# Patient Record
Sex: Female | Born: 1955 | Race: Black or African American | Hispanic: No | Smoking: Never smoker
Health system: Southern US, Community
[De-identification: ages and names within clinical notes are randomized; demographics above are authoritative.]

## PROBLEM LIST (undated history)

## (undated) DIAGNOSIS — K635 Polyp of colon: Secondary | ICD-10-CM

## (undated) DIAGNOSIS — K219 Gastro-esophageal reflux disease without esophagitis: Secondary | ICD-10-CM

## (undated) DIAGNOSIS — E7800 Pure hypercholesterolemia, unspecified: Secondary | ICD-10-CM

## (undated) DIAGNOSIS — I10 Essential (primary) hypertension: Secondary | ICD-10-CM

## (undated) DIAGNOSIS — E119 Type 2 diabetes mellitus without complications: Secondary | ICD-10-CM

## (undated) DIAGNOSIS — J45909 Unspecified asthma, uncomplicated: Secondary | ICD-10-CM

## (undated) DIAGNOSIS — E079 Disorder of thyroid, unspecified: Secondary | ICD-10-CM

## (undated) DIAGNOSIS — F39 Unspecified mood [affective] disorder: Secondary | ICD-10-CM

## (undated) DIAGNOSIS — H5440 Blindness, one eye, unspecified eye: Secondary | ICD-10-CM

## (undated) HISTORY — PX: PARTIAL HYSTERECTOMY: SHX80

## (undated) HISTORY — DX: Unspecified mood (affective) disorder: F39

## (undated) HISTORY — PX: THYROIDECTOMY, PARTIAL: SHX18

## (undated) HISTORY — DX: Morbid (severe) obesity due to excess calories: E66.01

## (undated) HISTORY — PX: APPENDECTOMY: SHX54

---

## 1997-01-21 DIAGNOSIS — E119 Type 2 diabetes mellitus without complications: Secondary | ICD-10-CM

## 1997-01-21 HISTORY — DX: Type 2 diabetes mellitus without complications: E11.9

## 2013-04-07 ENCOUNTER — Encounter (HOSPITAL_COMMUNITY): Payer: Self-pay | Admitting: Emergency Medicine

## 2013-04-07 ENCOUNTER — Inpatient Hospital Stay (HOSPITAL_COMMUNITY)
Admission: EM | Admit: 2013-04-07 | Discharge: 2013-04-11 | DRG: 392 | Disposition: A | Discharge status: Home / Self Care | Payer: Medicare HMO | Attending: Internal Medicine | Admitting: Internal Medicine

## 2013-04-07 ENCOUNTER — Emergency Department (HOSPITAL_COMMUNITY): Payer: Medicare HMO

## 2013-04-07 DIAGNOSIS — E7800 Pure hypercholesterolemia, unspecified: Secondary | ICD-10-CM | POA: Diagnosis present

## 2013-04-07 DIAGNOSIS — Z794 Long term (current) use of insulin: Secondary | ICD-10-CM

## 2013-04-07 DIAGNOSIS — I10 Essential (primary) hypertension: Secondary | ICD-10-CM | POA: Diagnosis present

## 2013-04-07 DIAGNOSIS — J45909 Unspecified asthma, uncomplicated: Secondary | ICD-10-CM | POA: Diagnosis present

## 2013-04-07 DIAGNOSIS — Z8719 Personal history of other diseases of the digestive system: Secondary | ICD-10-CM

## 2013-04-07 DIAGNOSIS — E119 Type 2 diabetes mellitus without complications: Secondary | ICD-10-CM | POA: Diagnosis present

## 2013-04-07 DIAGNOSIS — H7090 Unspecified mastoiditis, unspecified ear: Secondary | ICD-10-CM | POA: Diagnosis present

## 2013-04-07 DIAGNOSIS — J329 Chronic sinusitis, unspecified: Secondary | ICD-10-CM | POA: Diagnosis present

## 2013-04-07 DIAGNOSIS — Z79899 Other long term (current) drug therapy: Secondary | ICD-10-CM

## 2013-04-07 DIAGNOSIS — R1310 Dysphagia, unspecified: Secondary | ICD-10-CM

## 2013-04-07 DIAGNOSIS — Z9889 Other specified postprocedural states: Secondary | ICD-10-CM

## 2013-04-07 DIAGNOSIS — N390 Urinary tract infection, site not specified: Secondary | ICD-10-CM | POA: Diagnosis present

## 2013-04-07 DIAGNOSIS — R42 Dizziness and giddiness: Secondary | ICD-10-CM

## 2013-04-07 DIAGNOSIS — A498 Other bacterial infections of unspecified site: Secondary | ICD-10-CM | POA: Diagnosis present

## 2013-04-07 DIAGNOSIS — K219 Gastro-esophageal reflux disease without esophagitis: Principal | ICD-10-CM | POA: Diagnosis present

## 2013-04-07 DIAGNOSIS — K449 Diaphragmatic hernia without obstruction or gangrene: Secondary | ICD-10-CM | POA: Diagnosis present

## 2013-04-07 HISTORY — DX: Pure hypercholesterolemia, unspecified: E78.00

## 2013-04-07 HISTORY — DX: Dysphagia, unspecified: R13.10

## 2013-04-07 HISTORY — DX: Personal history of other diseases of the digestive system: Z87.19

## 2013-04-07 HISTORY — DX: Polyp of colon: K63.5

## 2013-04-07 HISTORY — DX: Disorder of thyroid, unspecified: E07.9

## 2013-04-07 HISTORY — DX: Other specified postprocedural states: Z98.890

## 2013-04-07 HISTORY — DX: Unspecified asthma, uncomplicated: J45.909

## 2013-04-07 HISTORY — DX: Essential (primary) hypertension: I10

## 2013-04-07 HISTORY — DX: Type 2 diabetes mellitus without complications: E11.9

## 2013-04-07 HISTORY — DX: Blindness, one eye, unspecified eye: H54.40

## 2013-04-07 HISTORY — DX: Dizziness and giddiness: R42

## 2013-04-07 HISTORY — DX: Urinary tract infection, site not specified: N39.0

## 2013-04-07 HISTORY — DX: Gastro-esophageal reflux disease without esophagitis: K21.9

## 2013-04-07 LAB — CBC WITH DIFFERENTIAL/PLATELET
BASOS ABS: 0 10*3/uL (ref 0.0–0.1)
BASOS PCT: 0 % (ref 0–1)
EOS ABS: 0.1 10*3/uL (ref 0.0–0.7)
Eosinophils Relative: 1 % (ref 0–5)
HCT: 42.9 % (ref 36.0–46.0)
Hemoglobin: 14.2 g/dL (ref 12.0–15.0)
Lymphocytes Relative: 48 % — ABNORMAL HIGH (ref 12–46)
Lymphs Abs: 5.2 10*3/uL — ABNORMAL HIGH (ref 0.7–4.0)
MCH: 26.3 pg (ref 26.0–34.0)
MCHC: 33.1 g/dL (ref 30.0–36.0)
MCV: 79.4 fL (ref 78.0–100.0)
Monocytes Absolute: 1.3 10*3/uL — ABNORMAL HIGH (ref 0.1–1.0)
Monocytes Relative: 12 % (ref 3–12)
NEUTROS ABS: 4.2 10*3/uL (ref 1.7–7.7)
NEUTROS PCT: 39 % — AB (ref 43–77)
Platelets: 331 10*3/uL (ref 150–400)
RBC: 5.4 MIL/uL — ABNORMAL HIGH (ref 3.87–5.11)
RDW: 14.8 % (ref 11.5–15.5)
WBC: 10.8 10*3/uL — ABNORMAL HIGH (ref 4.0–10.5)

## 2013-04-07 LAB — COMPREHENSIVE METABOLIC PANEL
ALBUMIN: 4 g/dL (ref 3.5–5.2)
ALT: 22 U/L (ref 0–35)
AST: 28 U/L (ref 0–37)
Alkaline Phosphatase: 73 U/L (ref 39–117)
BILIRUBIN TOTAL: 0.4 mg/dL (ref 0.3–1.2)
BUN: 13 mg/dL (ref 6–23)
CHLORIDE: 100 meq/L (ref 96–112)
CO2: 25 mEq/L (ref 19–32)
Calcium: 10 mg/dL (ref 8.4–10.5)
Creatinine, Ser: 1.12 mg/dL — ABNORMAL HIGH (ref 0.50–1.10)
GFR calc Af Amer: 63 mL/min — ABNORMAL LOW (ref >90)
GFR calc non Af Amer: 54 mL/min — ABNORMAL LOW (ref >90)
Glucose, Bld: 160 mg/dL — ABNORMAL HIGH (ref 70–99)
Potassium: 3.7 mEq/L (ref 3.7–5.3)
Sodium: 140 mEq/L (ref 137–147)
TOTAL PROTEIN: 8.1 g/dL (ref 6.0–8.3)

## 2013-04-07 LAB — URINALYSIS, ROUTINE W REFLEX MICROSCOPIC
Bilirubin Urine: NEGATIVE
GLUCOSE, UA: NEGATIVE mg/dL
Hgb urine dipstick: NEGATIVE
KETONES UR: NEGATIVE mg/dL
LEUKOCYTES UA: NEGATIVE
NITRITE: POSITIVE — AB
PH: 6 (ref 5.0–8.0)
Protein, ur: NEGATIVE mg/dL
SPECIFIC GRAVITY, URINE: 1.013 (ref 1.005–1.030)
Urobilinogen, UA: 0.2 mg/dL (ref 0.0–1.0)

## 2013-04-07 LAB — URINE MICROSCOPIC-ADD ON

## 2013-04-07 LAB — LIPASE, BLOOD: LIPASE: 23 U/L (ref 11–59)

## 2013-04-07 MED ORDER — MECLIZINE HCL 25 MG PO TABS
25.0000 mg | ORAL_TABLET | Freq: Once | ORAL | Status: DC
  Filled 2013-04-07: qty 1

## 2013-04-07 MED ORDER — DEXTROSE 5 % IV SOLN
1.0000 g | Freq: Once | INTRAVENOUS | Status: AC
  Administered 2013-04-07: 1 g via INTRAVENOUS
  Filled 2013-04-07: qty 10

## 2013-04-07 MED ORDER — ONDANSETRON HCL 4 MG/2ML IJ SOLN
INTRAMUSCULAR | Status: AC
Start: 2013-04-07 — End: 2013-04-07
  Administered 2013-04-07: 4 mg via INTRAVENOUS
  Filled 2013-04-07: qty 2

## 2013-04-07 MED ORDER — ONDANSETRON HCL 4 MG/2ML IJ SOLN
4.0000 mg | Freq: Once | INTRAMUSCULAR | Status: AC
  Administered 2013-04-07: 4 mg via INTRAVENOUS

## 2013-04-07 MED ORDER — LORAZEPAM 2 MG/ML IJ SOLN
1.0000 mg | Freq: Once | INTRAMUSCULAR | Status: AC
  Administered 2013-04-07: 1 mg via INTRAVENOUS
  Filled 2013-04-07: qty 1

## 2013-04-07 MED ORDER — SODIUM CHLORIDE 0.9 % IV BOLUS (SEPSIS)
1000.0000 mL | Freq: Once | INTRAVENOUS | Status: AC
  Administered 2013-04-07: 1000 mL via INTRAVENOUS

## 2013-04-07 NOTE — ED Notes (Signed)
The patient complained of lightheadedness and dizziness and very weak when standing up, no complains of nausea; while performing orthostatic vitals. The tech has reported to the RN in charge.

## 2013-04-07 NOTE — H&P (Signed)
Chief Complaint:  Trouble swallowing and dizziness  HPI: 58 yo female with sudden onset 4 days ago of dysphagia with solids and liquids along with vertigo.  She has never had vertigo before.  She has had some esophagus problem over 5 years ago where she was having trouble swallowing, she had an egd done and was told she had a high hiatal hernia, does not know if she had dilation then, but her problem resolved after the egd.  She has been vomiting everything up, she feels like things are getting stuck esp solids.  Also has been having persistent vertigo worse with movement but still present when lying still.  No fevers.  No cp.  No sob.  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  No drooling, no problem with moving any extremities.  No slurred speech.  Again no h/o vertigo before.  She also denies any dysuria, change in urinary frequency or any incontinence.  No foul smelling urine.  Have been asked to admit for uti, vertigo, and dysphagia.  Also her glucose is always over 250 per pt report, with poorly controlled dm.  Review of Systems:  Positive and negative as per HPI otherwise all other systems are negative  Past Medical History: Past Medical History  Diagnosis Date  . Hypertension   . Diabetes mellitus without complication   . Asthma   . High cholesterol   . Thyroid disease    History reviewed. No pertinent past surgical history.  Medications: Prior to Admission medications   Medication Sig Start Date End Date Taking? Authorizing Provider  albuterol (PROVENTIL HFA;VENTOLIN HFA) 108 (90 BASE) MCG/ACT inhaler Inhale 2 puffs into the lungs every 6 (six) hours as needed for wheezing or shortness of breath.   Yes Historical Provider, MD  albuterol (PROVENTIL) (2.5 MG/3ML) 0.083% nebulizer solution Take 2.5 mg by nebulization every 6 (six) hours as needed for wheezing or shortness of breath.   Yes Historical Provider, MD  BYDUREON 2 MG PEN Inject 1 each as directed once  a week. 03/19/13  Yes Historical Provider, MD  docusate sodium (COLACE) 100 MG capsule Take 100 mg by mouth 2 (two) times daily.   Yes Historical Provider, MD  esomeprazole (NEXIUM) 40 MG capsule Take 40 mg by mouth daily at 12 noon.   Yes Historical Provider, MD  Fluticasone-Salmeterol (ADVAIR) 250-50 MCG/DOSE AEPB Inhale 1 puff into the lungs 2 (two) times daily.   Yes Historical Provider, MD  glimepiride (AMARYL) 2 MG tablet Take 2 mg by mouth 2 (two) times daily.   Yes Historical Provider, MD  hydrochlorothiazide (MICROZIDE) 12.5 MG capsule Take 12.5 mg by mouth 2 (two) times daily.   Yes Historical Provider, MD  insulin glargine (LANTUS) 100 UNIT/ML injection Inject 30 Units into the skin at bedtime.   Yes Historical Provider, MD  insulin NPH-regular Human (NOVOLIN 70/30) (70-30) 100 UNIT/ML injection Inject 40-50 Units into the skin 2 (two) times daily with a meal. 50 units in am, 40 units in pm   Yes Historical Provider, MD  levothyroxine (SYNTHROID, LEVOTHROID) 175 MCG tablet Take 175 mcg by mouth daily before breakfast.   Yes Historical Provider, MD  metoprolol (LOPRESSOR) 100 MG tablet Take 100 mg by mouth 2 (two) times daily.   Yes Historical Provider, MD  simvastatin (ZOCOR) 40 MG tablet Take 40 mg by mouth daily at 6 PM.   Yes Historical Provider, MD  zolpidem (AMBIEN) 10 MG tablet Take 10 mg by mouth at bedtime.  Yes Historical Provider, MD    Allergies:   Allergies  Allergen Reactions  . Avelox [Moxifloxacin Hcl In Nacl] Hives, Itching and Rash  . Oxycontin [Oxycodone Hcl] Hives, Itching and Rash  . Penicillins Hives, Itching and Rash    Social History:  reports that she has never smoked. She does not have any smokeless tobacco history on file. She reports that she does not drink alcohol. Her drug history is not on file.  Family History: History reviewed. No pertinent family history.  Physical Exam: Filed Vitals:   04/07/13 1930 04/07/13 2027 04/07/13 2030 04/07/13 2221   BP: 127/47 128/50 125/52   Pulse: 84 84 89 86  Temp:      TempSrc:      Resp:  18    SpO2: 100% 100% 99%    General appearance: alert, cooperative and no distress Head: Normocephalic, without obvious abnormality, atraumatic Eyes: negative Nose: Nares normal. Septum midline. Mucosa normal. No drainage or sinus tenderness. Neck: no JVD and supple, symmetrical, trachea midline Lungs: clear to auscultation bilaterally Heart: regular rate and rhythm, S1, S2 normal, no murmur, click, rub or gallop Abdomen: soft, non-tender; bowel sounds normal; no masses,  no organomegaly Extremities: extremities normal, atraumatic, no cyanosis or edema Pulses: 2+ and symmetric Skin: Skin color, texture, turgor normal. No rashes or lesions Neurologic: Grossly normal   Labs on Admission:   Recent Labs  04/07/13 1313  NA 140  K 3.7  CL 100  CO2 25  GLUCOSE 160*  BUN 13  CREATININE 1.12*  CALCIUM 10.0    Recent Labs  04/07/13 1313  AST 28  ALT 22  ALKPHOS 73  BILITOT 0.4  PROT 8.1  ALBUMIN 4.0    Recent Labs  04/07/13 1313  LIPASE 23    Recent Labs  04/07/13 1313  WBC 10.8*  NEUTROABS 4.2  HGB 14.2  HCT 42.9  MCV 79.4  PLT 331    Radiological Exams on Admission: Ct Head Wo Contrast  04/07/2013   CLINICAL DATA:  Dizziness with sore throat and leukocytosis.  EXAM: CT HEAD WITHOUT CONTRAST  TECHNIQUE: Contiguous axial images were obtained from the base of the skull through the vertex without intravenous contrast.  COMPARISON:  None.  FINDINGS: There is no evidence of acute intracranial hemorrhage, mass lesion, brain edema or extra-axial fluid collection. The ventricles and subarachnoid spaces are appropriately sized for age. There is no CT evidence of acute cortical infarction. There is a small lipoma associated with the posterior aspect of the interhemispheric fissure. There is no generalized fat within the subarachnoid spaces.  There is partial opacification of the left  maxillary sinus. The visualized paranasal sinuses and mastoid air cells are otherwise clear. The calvarium is intact.  IMPRESSION: 1. No acute intracranial findings. 2. Incidental small lipoma associated with the posterior aspect of the interhemispheric fissure. 3. Partial opacification of the left maxillary sinus.   Electronically Signed   By: Roxy HorsemanBill  Veazey M.D.   On: 04/07/2013 20:02    Assessment/Plan  58 yo female with onset of dysphagia and vertigo 4 days ago with multiple risk factors for cva/tia  Principal Problem:   Dysphagia-  The combination of the dysphagia and vertigo happening at the same time is concerning for possible cva, although she has h/o dysphagia in past told to be due to hiatal hernia.  GI has been consulted for evaluation in am.  Will obtain mri in am to evaluate if cva, if positive proceed with complete cva work  up.  Keep npo tonight, ivf.  Active Problems:   Diabetes mellitus without complication  ssi   High cholesterol   Hypertension   Vertigo-  As above, threw up the meclizine due to the dysphagia.  ivf   Status post dilation of esophageal narrowing   UTI (lower urinary tract infection)- vs asympomatic bacturia, pt has no urinary symptoms.  Rocephin until cx resulted.  I do not think her vertigo is due to her possible uti.    Emily Dixon A 04/07/2013, 10:22 PM

## 2013-04-07 NOTE — ED Notes (Signed)
Per pt sts that back in February she had tests run and they called her today and told her that her WBC was elevated. sts that when she stands up from sitting she gets dizzy. sts also right throat swelling and neck swelling and when she lays down her food comes back up.

## 2013-04-07 NOTE — ED Provider Notes (Signed)
CSN: [id number]     Arrival date & time 04/07/13  1238 History   First MD Initiated Contact with Patient 04/07/13 1659     Chief Complaint  Patient presents with  . Dizziness  . Emesis     (Consider location/radiation/quality/duration/timing/severity/associated sxs/prior Treatment) HPI  58 year old female with history of GERD, insulin-dependent diabetes, hypertension, asthma, thyroid disease who presents complaining of dizziness. Patient reports for the past 4 days she has had recurrent episode of dizziness which she describes as room spinning sensation. Symptoms worsen when she changes position especially from laying in bed to a sitting position and from a sitting position to a standing position. Episodes usually lasting for seconds to minutes and usually resolve when she lays still. She also had reported having history of GERD, and states for the past 4 days she has had trouble with swallowing. Report feeling of globus sensation in throat. States she is having trouble swallow food and when she lays down she begins to feel nauseous and vomiting up food product. Symptoms seemed to get progressively worse. Usually with solid food but now with liquid. She denies fever, chills, headache, ear pain, hearing changes, runny nose, sneezing, cough, hematemesis, chest pain, shortness of breath, back pain, abdominal pain, numbness or weakness.  Past Medical History  Diagnosis Date  . Hypertension   . Diabetes mellitus without complication   . Asthma   . High cholesterol   . Thyroid disease    History reviewed. No pertinent past surgical history. History reviewed. No pertinent family history. History  Substance Use Topics  . Smoking status: Never Smoker   . Smokeless tobacco: Not on file  . Alcohol Use: No   OB History   Grav Para Term Preterm Abortions TAB SAB Ect Mult Living                 Review of Systems  All other systems reviewed and are negative.      Allergies  Avelox;  Oxycontin; and Penicillins  Home Medications  No current outpatient prescriptions on file. BP 140/81  Pulse 72  Temp(Src) 97.7 F (36.5 C) (Oral)  Resp 21  SpO2 100% Physical Exam  Nursing note and vitals reviewed. Constitutional: She is oriented to person, place, and time. She appears well-developed and well-nourished. No distress.  Awake, alert, nontoxic appearance  HENT:  Head: Atraumatic.  Right Ear: External ear normal.  Left Ear: External ear normal.  Mouth/Throat: Oropharynx is clear and moist.  Throat: Uvula is midline, no tonsillar enlargement or exudates, no trismus, no evidence of deep tissue infection, no obvious signs of obstruction.  Eyes: Conjunctivae are normal. Right eye exhibits no discharge. Left eye exhibits no discharge.  Neck: Neck supple. No tracheal deviation present. No thyromegaly present.  Cardiovascular: Normal rate and regular rhythm.   Pulmonary/Chest: Effort normal. No stridor. No respiratory distress. She exhibits no tenderness.  Abdominal: Soft. There is no tenderness. There is no rebound.  Musculoskeletal: She exhibits no tenderness.  ROM appears intact, no obvious focal weakness  Neurological: She is alert and oriented to person, place, and time. She has normal strength. No cranial nerve deficit or sensory deficit. She exhibits normal muscle tone. GCS eye subscore is 4. GCS verbal subscore is 5. GCS motor subscore is 6.  Mental status and motor strength appears intact  Blindness in L eye, making it difficult to test coordination.    5/5 strength to all 4 extremities.    Skin: No rash noted.  Psychiatric: She has  a normal mood and affect.    ED Course  Procedures (including critical care time)  5:27 PM Patient reports having symptoms of dysphasia. Has history of GERD which I suspect it may be GERD related. She has no focal neuro deficit to suggest an acute stroke. Also reports having bouts of dizziness that favors peripheral vertigo instead  of central vertigo as it is positional dependent.  5:55 PM Stroke swallows screen were perform but patient unable to swallow.  Patient cannot swallow pill at this time as well. Given that she complained of dizziness, and having trouble swallowing for the past 4 days, will obtain head CT scan and we'll consider a stroke workup if necessary.  Pt did report having esophageal dilation many years ago.  Her GI specialist is in [place].    7:20 PM UA with evidence of urinary tract infection. Rocephin given. Head CT without acute finding.  Doubt stroke.  Able to ambulate without difficulty.    9:55 PM Once again pt cannot tolerates both liquid and solid, she vomited it up after 10 minute.  Will consult GI and will also have pt admitted for further care.    10:07 PM I have consulted GI specialist, Dr. Russella Dar, who agrees to see pt tomorrow morning.  I have consulted [facility] Dr. Onalee Hua who agrees to admit pt to obs, tele, team 10 for further care.  She also request orthostatic vital sign.    Labs Review Labs Reviewed  CBC WITH DIFFERENTIAL - Abnormal; Notable for the following:    WBC 10.8 (*)    RBC 5.40 (*)    Neutrophils Relative % 39 (*)    Lymphocytes Relative 48 (*)    Lymphs Abs 5.2 (*)    Monocytes Absolute 1.3 (*)    All other components within normal limits  COMPREHENSIVE METABOLIC PANEL - Abnormal; Notable for the following:    Glucose, Bld 160 (*)    Creatinine, Ser 1.12 (*)    GFR calc non Af Amer 54 (*)    GFR calc Af Amer 63 (*)    All other components within normal limits  URINALYSIS, ROUTINE W REFLEX MICROSCOPIC - Abnormal; Notable for the following:    APPearance HAZY (*)    Nitrite POSITIVE (*)    All other components within normal limits  URINE MICROSCOPIC-ADD ON - Abnormal; Notable for the following:    Bacteria, UA MANY (*)    All other components within normal limits  LIPASE, BLOOD   Imaging Review Ct Head Wo Contrast  04/07/2013   CLINICAL DATA:  Dizziness  with sore throat and leukocytosis.  EXAM: CT HEAD WITHOUT CONTRAST  TECHNIQUE: Contiguous axial images were obtained from the base of the skull through the vertex without intravenous contrast.  COMPARISON:  None.  FINDINGS: There is no evidence of acute intracranial hemorrhage, mass lesion, brain edema or extra-axial fluid collection. The ventricles and subarachnoid spaces are appropriately sized for age. There is no CT evidence of acute cortical infarction. There is a small lipoma associated with the posterior aspect of the interhemispheric fissure. There is no generalized fat within the subarachnoid spaces.  There is partial opacification of the left maxillary sinus. The visualized paranasal sinuses and mastoid air cells are otherwise clear. The calvarium is intact.  IMPRESSION: 1. No acute intracranial findings. 2. Incidental small lipoma associated with the posterior aspect of the interhemispheric fissure. 3. Partial opacification of the left maxillary sinus.   Electronically Signed   By: Roxy Horseman  M.D.   On: 04/07/2013 20:02     EKG Interpretation   Date/Time:  Wednesday April 07 2013 16:58:39 EDT Ventricular Rate:  74 PR Interval:  158 QRS Duration: 85 QT Interval:  393 QTC Calculation: 436 R Axis:   65 Text Interpretation:  Sinus rhythm Probable left atrial enlargement RSR'  in V1 or V2, right VCD or RVH Confirmed by ZAVITZ  MD, JOSHUA ([id number]) on  04/07/2013 6:44:34 PM      MDM   Final diagnoses:  Dysphagia  Vertigo  UTI (lower urinary tract infection)    BP 142/89  Pulse 97  Temp(Src) 97.7 F (36.5 C) (Oral)  Resp 18  SpO2 100%  I have reviewed nursing notes and vital signs. I personally reviewed the imaging tests through PACS system  I reviewed available ER/hospitalization records thought the EMR     Fayrene HelperBowie Amedee Cerrone, [place] 04/07/13 2247

## 2013-04-07 NOTE — ED Notes (Addendum)
Pt vomiting. Reports she is nauseous. Fluid challenge fail. Greta DoomBowie, PA made aware.

## 2013-04-07 NOTE — ED Notes (Addendum)
Pt given cup of ginger ale and crackers per order from [place], [place] and he is aware pt NPO due to failed swallow screen. Will re-assess in 15 minutes.

## 2013-04-08 ENCOUNTER — Encounter (HOSPITAL_COMMUNITY): Payer: Self-pay | Admitting: General Practice

## 2013-04-08 ENCOUNTER — Observation Stay (HOSPITAL_COMMUNITY): Payer: Medicare HMO

## 2013-04-08 LAB — BASIC METABOLIC PANEL
BUN: 11 mg/dL (ref 6–23)
CALCIUM: 9.1 mg/dL (ref 8.4–10.5)
CO2: 23 mEq/L (ref 19–32)
Chloride: 102 mEq/L (ref 96–112)
Creatinine, Ser: 0.89 mg/dL (ref 0.50–1.10)
GFR calc Af Amer: 83 mL/min — ABNORMAL LOW (ref >90)
GFR, EST NON AFRICAN AMERICAN: 72 mL/min — AB (ref >90)
Glucose, Bld: 155 mg/dL — ABNORMAL HIGH (ref 70–99)
POTASSIUM: 3.5 meq/L — AB (ref 3.7–5.3)
SODIUM: 140 meq/L (ref 137–147)

## 2013-04-08 LAB — CBC
HCT: 36.8 % (ref 36.0–46.0)
HEMOGLOBIN: 12.1 g/dL (ref 12.0–15.0)
MCH: 25.9 pg — ABNORMAL LOW (ref 26.0–34.0)
MCHC: 32.9 g/dL (ref 30.0–36.0)
MCV: 78.8 fL (ref 78.0–100.0)
PLATELETS: 295 10*3/uL (ref 150–400)
RBC: 4.67 MIL/uL (ref 3.87–5.11)
RDW: 14.9 % (ref 11.5–15.5)
WBC: 11.7 10*3/uL — ABNORMAL HIGH (ref 4.0–10.5)

## 2013-04-08 LAB — GLUCOSE, CAPILLARY
GLUCOSE-CAPILLARY: 161 mg/dL — AB (ref 70–99)
GLUCOSE-CAPILLARY: 162 mg/dL — AB (ref 70–99)
GLUCOSE-CAPILLARY: 214 mg/dL — AB (ref 70–99)
Glucose-Capillary: 156 mg/dL — ABNORMAL HIGH (ref 70–99)
Glucose-Capillary: 184 mg/dL — ABNORMAL HIGH (ref 70–99)

## 2013-04-08 LAB — HEMOGLOBIN A1C
HEMOGLOBIN A1C: 10.7 % — AB (ref <5.7)
Mean Plasma Glucose: 260 mg/dL — ABNORMAL HIGH (ref <117)

## 2013-04-08 MED ORDER — HYDROMORPHONE HCL PF 2 MG/ML IJ SOLN: 0.5000 mg | INTRAMUSCULAR | Status: DC | PRN

## 2013-04-08 MED ORDER — ALBUTEROL SULFATE (2.5 MG/3ML) 0.083% IN NEBU
2.5000 mg | INHALATION_SOLUTION | Freq: Four times a day (QID) | RESPIRATORY_TRACT | Status: DC
  Administered 2013-04-08: 2.5 mg via RESPIRATORY_TRACT
  Filled 2013-04-08 (×2): qty 3

## 2013-04-08 MED ORDER — ALBUTEROL SULFATE (2.5 MG/3ML) 0.083% IN NEBU: 2.5000 mg | INHALATION_SOLUTION | Freq: Four times a day (QID) | RESPIRATORY_TRACT | Status: DC | PRN

## 2013-04-08 MED ORDER — TRAMADOL HCL 50 MG PO TABS
50.0000 mg | ORAL_TABLET | Freq: Three times a day (TID) | ORAL | Status: DC | PRN
  Filled 2013-04-08: qty 1

## 2013-04-08 MED ORDER — DEXTROSE 5 % IV SOLN
1.0000 g | INTRAVENOUS | Status: DC
  Administered 2013-04-08 – 2013-04-10 (×3): 1 g via INTRAVENOUS
  Filled 2013-04-08 (×4): qty 10

## 2013-04-08 MED ORDER — ALBUTEROL SULFATE HFA 108 (90 BASE) MCG/ACT IN AERS: 2.0000 | INHALATION_SPRAY | Freq: Four times a day (QID) | RESPIRATORY_TRACT | Status: DC | PRN

## 2013-04-08 MED ORDER — ONDANSETRON HCL 4 MG/2ML IJ SOLN
4.0000 mg | Freq: Four times a day (QID) | INTRAMUSCULAR | Status: DC | PRN
  Administered 2013-04-10 (×3): 4 mg via INTRAVENOUS
  Filled 2013-04-08 (×3): qty 2

## 2013-04-08 MED ORDER — HYDROMORPHONE HCL PF 1 MG/ML IJ SOLN
0.5000 mg | Freq: Once | INTRAMUSCULAR | Status: AC
  Administered 2013-04-08: 0.5 mg via INTRAVENOUS
  Filled 2013-04-08: qty 1

## 2013-04-08 MED ORDER — ONDANSETRON HCL 4 MG PO TABS: 4.0000 mg | ORAL_TABLET | Freq: Four times a day (QID) | ORAL | Status: DC | PRN

## 2013-04-08 MED ORDER — SODIUM CHLORIDE 0.9 % IJ SOLN
3.0000 mL | Freq: Two times a day (BID) | INTRAMUSCULAR | Status: DC
  Administered 2013-04-08 – 2013-04-11 (×6): 3 mL via INTRAVENOUS

## 2013-04-08 MED ORDER — HYDROMORPHONE HCL PF 1 MG/ML IJ SOLN
0.5000 mg | INTRAMUSCULAR | Status: DC | PRN
  Administered 2013-04-08 – 2013-04-11 (×5): 0.5 mg via INTRAVENOUS
  Filled 2013-04-08 (×5): qty 1

## 2013-04-08 MED ORDER — INSULIN ASPART 100 UNIT/ML ~~LOC~~ SOLN
0.0000 [IU] | SUBCUTANEOUS | Status: DC
  Administered 2013-04-08 (×2): 2 [IU] via SUBCUTANEOUS
  Administered 2013-04-08: 3 [IU] via SUBCUTANEOUS
  Administered 2013-04-08 – 2013-04-09 (×3): 2 [IU] via SUBCUTANEOUS
  Administered 2013-04-09: 5 [IU] via SUBCUTANEOUS
  Administered 2013-04-09: 3 [IU] via SUBCUTANEOUS
  Administered 2013-04-10: 7 [IU] via SUBCUTANEOUS
  Administered 2013-04-10: 3 [IU] via SUBCUTANEOUS
  Administered 2013-04-10: 5 [IU] via SUBCUTANEOUS
  Administered 2013-04-10 – 2013-04-11 (×2): 3 [IU] via SUBCUTANEOUS
  Administered 2013-04-11: 2 [IU] via SUBCUTANEOUS

## 2013-04-08 MED ORDER — ALBUTEROL SULFATE (2.5 MG/3ML) 0.083% IN NEBU: 2.5000 mg | INHALATION_SOLUTION | Freq: Two times a day (BID) | RESPIRATORY_TRACT | Status: DC

## 2013-04-08 MED ORDER — ALUM & MAG HYDROXIDE-SIMETH 200-200-20 MG/5ML PO SUSP
30.0000 mL | Freq: Four times a day (QID) | ORAL | Status: DC | PRN
  Administered 2013-04-08: 30 mL via ORAL
  Filled 2013-04-08: qty 30

## 2013-04-08 MED ORDER — POTASSIUM CHLORIDE CRYS ER 20 MEQ PO TBCR
40.0000 meq | EXTENDED_RELEASE_TABLET | Freq: Once | ORAL | Status: AC
  Administered 2013-04-08: 40 meq via ORAL
  Filled 2013-04-08: qty 2

## 2013-04-08 MED ORDER — SODIUM CHLORIDE 0.9 % IV SOLN: INTRAVENOUS | Status: AC

## 2013-04-08 NOTE — Evaluation (Signed)
Clinical/Bedside Swallow Evaluation Patient Details  Name: Emily Dixon MRN: [id number] Date of Birth: 1955/02/13  Today's Date: 04/08/2013 Time: 7846-9629 SLP Time Calculation (min): 15 min  Past Medical History:  Past Medical History  Diagnosis Date  . Hypertension   . DM (diabetes mellitus), type 2 1999    requires insulin  . Asthma   . High cholesterol   . Thyroid disease     Partial thyroidectomy  . GERD (gastroesophageal reflux disease)     with HH.  EGD ~ 2010 in [place]  . Blind left eye     since childhood.   . Colon polyps 2000, 2014    probably adenomatous.  on q 3 year colonoscopy schedule.    Past Surgical History:  Past Surgical History  Procedure Laterality Date  . Appendectomy    . Partial hysterectomy    . Thyroidectomy, partial     HPI:  58 yo female with sudden onset 4 days ago of dysphagia with solids and liquids along with vertigo. She has never had vertigo before. She has had some esophagus problem over 5 years ago where she was having trouble swallowing, she had an egd done and was told she had a high hiatal hernia, does not know if she had dilation then, but her problem resolved after the egd. She has been vomiting everything up, she feels like things are getting stuck esp solids. Also has been having persistent vertigo worse with movement but still present when lying still. Barium swallow this admission revealed a Moderate gastroesophageal reflux, no evidence of esophageal stricture or significant dysmotility and no pharyngeal abnormalities observed.   Assessment / Plan / Recommendation Clinical Impression  Patient presents with signs of a primary esophageal dysphagia based on bedside examination. Patient with what appears to be normal oropharyngeal swallowing function however with c/o globus and regurgitation which along with h/o hiatal hernia and GERD, raises suspicion for esophageal origin. Education complete with patient regarding general safe  esophageal precautions. No SLP f/u indicated. Defer f/u to GI.     Aspiration Risk  Mild    Diet Recommendation Regular;Thin liquid (or diet consistency per GI)   Liquid Administration via: Cup;Straw Medication Administration: Whole meds with liquid Supervision: Patient able to self feed Compensations: Slow rate;Small sips/bites;Follow solids with liquid Postural Changes and/or Swallow Maneuvers: Seated upright 90 degrees;Upright 30-60 min after meal    Other  Recommendations Recommended Consults: Consider GI evaluation;Consider esophageal assessment Oral Care Recommendations: Oral care BID   Follow Up Recommendations  None       Pertinent Vitals/Pain N/a        Swallow Study    General HPI: 58 yo female with sudden onset 4 days ago of dysphagia with solids and liquids along with vertigo. She has never had vertigo before. She has had some esophagus problem over 5 years ago where she was having trouble swallowing, she had an egd done and was told she had a high hiatal hernia, does not know if she had dilation then, but her problem resolved after the egd. She has been vomiting everything up, she feels like things are getting stuck esp solids. Also has been having persistent vertigo worse with movement but still present when lying still. Barium swallow this admission revealed a Moderate gastroesophageal reflux, no evidence of esophageal stricture or significant dysmotility and no pharyngeal abnormalities observed. Type of Study: Bedside swallow evaluation Previous Swallow Assessment: see HPI Diet Prior to this Study: NPO Temperature Spikes Noted: No Respiratory Status:  Room air History of Recent Intubation: No Behavior/Cognition: Alert;Cooperative;Pleasant mood Oral Cavity - Dentition: Adequate natural dentition Self-Feeding Abilities: Able to feed self Patient Positioning: Upright in bed Baseline Vocal Quality: Clear Volitional Cough: Strong Volitional Swallow: Able to elicit     Oral/Motor/Sensory Function Overall Oral Motor/Sensory Function: Appears within functional limits for tasks assessed   Ice Chips Ice chips: Not tested   Thin Liquid Thin Liquid: Within functional limits (except globus)    Nectar Thick Nectar Thick Liquid: Not tested   Honey Thick Honey Thick Liquid: Not tested   Puree Puree: Within functional limits (except globus)   Solid   GO   Hezikiah Retzloff MA, CCC-SLP [phone]  Solid: Within functional limits (except globus)       Emily Dixon Emily Dixon 04/08/2013,3:21 PM

## 2013-04-08 NOTE — ED Provider Notes (Signed)
Medical screening examination/treatment/procedure(s) were conducted as a shared visit with non-physician practitioner(s) or resident and myself. I personally evaluated the patient during the encounter and agree with the findings and plan unless otherwise indicated.  I have personally reviewed any xrays and/ or EKG's with the provider and I agree with interpretation.  Recurrent vertigo sxs with sitting up for 4 days. Mild similar sxs in the past. No stroke hx or ha. WBC elevated recently. Pt also has mild worsening right throat swelling/ food comes up at times, pt has had esophagus stretched in the past, similar. Pt unable to tolerate liquids. Exam neck supple, no stridor, lungs clear, 5+ strength in UE and LE with f/e at major joints except mild decreased strength right arm flexion however pain related at elbow. No drift. Neck supple.  Sensation to palpation intact in UE and LE.  CNs 2-12 grossly intact except known left eye blindness. EOMFI. PERRL.  Coordination intact bilateral.  Visual fields intact to finger testing on right eye.  Clinically peripheral vertigo with UTI. Fluids and abx.  Fup outpt with GI and pcp. Pt well appearing, smiling on exam.  Vertigo, UTI, Vomiting   Enid SkeensJoshua M Kylyn Sookram, MD 04/08/13 [phone]

## 2013-04-08 NOTE — Consult Note (Signed)
Patient seen, examined, and I agree with the above documentation, including the assessment and plan. Agree with barium swallow for further evaluation, with tablet Possible EGD thereafter

## 2013-04-08 NOTE — Progress Notes (Signed)
UR completed. Patient changed to inpatient- requiring IV antibiotics.  

## 2013-04-08 NOTE — Progress Notes (Signed)
[facility] PROGRESS NOTE  Emily Dixon ZOX:[id number] DOB: 03/16/1955 DOA: 04/07/2013 PCP: Pcp Not In System  Assessment/Plan: 1-Dysphagia; Barium swallow. GI consulted. Speech evaluation.  2-Dizziness, vertigo. MRI pending.  3-UTI; UA with positive nitrates. Ceftriaxone. Follow up urine culture.  4-Diabetes; SSI.   Code Status: Full Code.  Family Communication: Care discussed with patient.  Disposition Plan: remain inpatient.    Consultants:  GI  Procedures:  none  Antibiotics:  Ceftriaxone 3-18  HPI/Subjective: Complaining of headaches, dizziness better.   Objective: Filed Vitals:   04/08/13 1638  BP: 139/81  Pulse: 102  Temp:   Resp: 20    Intake/Output Summary (Last 24 hours) at 04/08/13 1651 Last data filed at 04/08/13 1421  Gross per 24 hour  Intake      0 ml  Output      0 ml  Net      0 ml   Filed Weights   04/08/13 0013  Weight: 117.3 kg (258 lb 9.6 oz)    Exam:   General: No distress.   Cardiovascular: S 1, S 2 RRR  Respiratory: bilateral wheezes.   Abdomen: BS present, soft.   Musculoskeletal: no edema.   Data Reviewed: Basic Metabolic Panel:  Recent Labs Lab 04/07/13 1313 04/08/13 0140  NA 140 140  K 3.7 3.5*  CL 100 102  CO2 25 23  GLUCOSE 160* 155*  BUN 13 11  CREATININE 1.12* 0.89  CALCIUM 10.0 9.1   Liver Function Tests:  Recent Labs Lab 04/07/13 1313  AST 28  ALT 22  ALKPHOS 73  BILITOT 0.4  PROT 8.1  ALBUMIN 4.0    Recent Labs Lab 04/07/13 1313  LIPASE 23   No results found for this basename: AMMONIA,  in the last 168 hours CBC:  Recent Labs Lab 04/07/13 1313 04/08/13 0140  WBC 10.8* 11.7*  NEUTROABS 4.2  --   HGB 14.2 12.1  HCT 42.9 36.8  MCV 79.4 78.8  PLT 331 295   Cardiac Enzymes: No results found for this basename: CKTOTAL, CKMB, CKMBINDEX, TROPONINI,  in the last 168 hours BNP (last 3 results) No results found for this basename: PROBNP,  in the last 8760  hours CBG:  Recent Labs Lab 04/08/13 0012 04/08/13 0420 04/08/13 0749  GLUCAP 162* 161* 156*    No results found for this or any previous visit (from the past 240 hour(s)).   Studies: Ct Head Wo Contrast  04/07/2013   CLINICAL DATA:  Dizziness with sore throat and leukocytosis.  EXAM: CT HEAD WITHOUT CONTRAST  TECHNIQUE: Contiguous axial images were obtained from the base of the skull through the vertex without intravenous contrast.  COMPARISON:  None.  FINDINGS: There is no evidence of acute intracranial hemorrhage, mass lesion, brain edema or extra-axial fluid collection. The ventricles and subarachnoid spaces are appropriately sized for age. There is no CT evidence of acute cortical infarction. There is a small lipoma associated with the posterior aspect of the interhemispheric fissure. There is no generalized fat within the subarachnoid spaces.  There is partial opacification of the left maxillary sinus. The visualized paranasal sinuses and mastoid air cells are otherwise clear. The calvarium is intact.  IMPRESSION: 1. No acute intracranial findings. 2. Incidental small lipoma associated with the posterior aspect of the interhemispheric fissure. 3. Partial opacification of the left maxillary sinus.   Electronically Signed   By: Roxy HorsemanBill  Veazey M.D.   On: 04/07/2013 20:02   Dg Esophagus  04/08/2013   CLINICAL DATA:  Dysphagia with regurgitation. History of partial thyroidectomy. Evaluate for stricture or dysmotility.  EXAM: ESOPHOGRAM / BARIUM SWALLOW / BARIUM TABLET STUDY  TECHNIQUE: Combined double contrast and single contrast examination performed using effervescent crystals, thick barium liquid, and thin barium liquid. The patient was observed with fluoroscopy swallowing a 13mm barium sulphate tablet.  FLUOROSCOPY TIME:  36 seconds.  COMPARISON:  None.  FINDINGS: The esophageal motility appears within normal limits. Rapid sequence imaging of the pharynx in the AP and lateral projections  demonstrates no mucosal abnormalities. There are surgical clips in the lower left neck consistent with prior thyroid surgery. Multilevel cervical spondylosis is noted without significant extrinsic compression on the cervical esophagus.  The thoracic esophagus demonstrates no mucosal ulceration, stricture or mass. There is a small reducible hiatal hernia and significant reflux to the level of the carina was noted with the water siphon test. A 13 mm barium tablet was administered and passed without delay into the stomach.  IMPRESSION: 1. Moderate gastroesophageal reflux. 2. No evidence of esophageal stricture or significant dysmotility. 3. No pharyngeal abnormalities observed.   Electronically Signed   By: Roxy Horseman M.D.   On: 04/08/2013 12:22    Scheduled Meds: . albuterol  2.5 mg Nebulization Q6H  . cefTRIAXone (ROCEPHIN)  IV  1 g Intravenous Q24H  . insulin aspart  0-9 Units Subcutaneous 6 times per day  . sodium chloride  3 mL Intravenous Q12H   Continuous Infusions:   Principal Problem:   Dysphagia Active Problems:   Diabetes mellitus without complication   High cholesterol   Hypertension   Vertigo   Status post dilation of esophageal narrowing   UTI (lower urinary tract infection)    Time spent: 25 minutes.     Hartley Barefoot A  [facility] Pager [phone]. If 7PM-7AM, please contact night-coverage at [email or web address], password [email or web address] 04/08/2013, 4:51 PM  LOS: 1 day

## 2013-04-08 NOTE — Progress Notes (Signed)
Esophagram: FINDINGS:  The esophageal motility appears within normal limits. Rapid sequence  imaging of the pharynx in the AP and lateral projections  demonstrates no mucosal abnormalities. There are surgical clips in  the lower left neck consistent with prior thyroid surgery.  Multilevel cervical spondylosis is noted without significant  extrinsic compression on the cervical esophagus.  The thoracic esophagus demonstrates no mucosal ulceration, stricture  or mass. There is a small reducible hiatal hernia and significant  reflux to the level of the carina was noted with the water siphon  test. A 13 mm barium tablet was administered and passed without  delay into the stomach.  IMPRESSION:  1. Moderate gastroesophageal reflux.  2. No evidence of esophageal stricture or significant dysmotility.  3. No pharyngeal abnormalities observed.    Plan: Setting pt up for EGD tomorrow.

## 2013-04-08 NOTE — Consult Note (Signed)
[facility] Consult: 9:24 AM 04/08/2013  LOS: 1 day    Referring Provider:  Primary Care Physician:  Dr Lucita Lora at [facility]  Primary Gastroenterologist:  Dr at [facility] in [place]  Pulmonary: Dr Chilton Si at [facility]   Reason for Consultation:  Dysphagia.    HPI: Emily Dixon is a 58 y.o. female.  Has Type 2 DM, requiring insulin. Asthma which requires periodic oral steroids. Hx GERD/HH with well controlled sxs on BID Nexium. S/p Hemorrhoidectomy in 2014.  Hx of colon polyps (proably adenomas) on both her colonoscopies.  Last colon was in 2014.  + maternal and sibling (sister) hx of colon cancer.   5 years ago she had dysphagia similar to recent sxs and had EGD showing HH.  Does not recal dilation of a stricture.  Starting Sunday, 5 days ago, felt chicken sticking in upper esophagus/throat and it made her feel SOB and she was dizzy.  This continued in following days and would occur even with cereal.  Water seemed to be ok.  Would regurgitate the food.  Some sense of mid esohageal pressure with the regurgitation.   No blood in regurgitated material.  No abdominal pain.  Visiting dtr in [place] so came to [facility] ED.  She lives in [place] and her care is there.   Sugars run >250s into 300s generally.  No sores in mouth.  Some polyuria and polydipsia.  Weight up 20# in last 4 to 6 weeks due to use of oral prednisone for respiratory disease.Marland Kitchen  No NSAIDs.     Past Medical History  Diagnosis Date  . Hypertension   . DM (diabetes mellitus), type 2 1999    requires insulin  . Asthma   . High cholesterol   . Thyroid disease     Partial thyroidectomy  . GERD (gastroesophageal reflux disease)     with HH.  EGD ~ 2010 in [place]  . Blind left eye     since childhood.   . Colon polyps 2000, 2014    probably  adenomatous.  on q 3 year colonoscopy schedule.     Past Surgical History  Procedure Laterality Date  . Appendectomy    . Partial hysterectomy    . Thyroidectomy, partial      Prior to Admission medications   Medication Sig Start Date End Date Taking? Authorizing Provider  albuterol (PROVENTIL HFA;VENTOLIN HFA) 108 (90 BASE) MCG/ACT inhaler Inhale 2 puffs into the lungs every 6 (six) hours as needed for wheezing or shortness of breath.   Yes Historical Provider, MD  albuterol (PROVENTIL) (2.5 MG/3ML) 0.083% nebulizer solution Take 2.5 mg by nebulization every 6 (six) hours as needed for wheezing or shortness of breath.   Yes Historical Provider, MD  BYDUREON 2 MG PEN Inject 1 each as directed once a week. 03/19/13  Yes Historical Provider, MD  docusate sodium (COLACE) 100 MG capsule Take 100 mg by mouth 2 (two) times daily.   Yes Historical Provider, MD  esomeprazole (NEXIUM) 40 MG capsule Take 40 mg by mouth  daily at 12 noon.   Yes Historical Provider, MD  Fluticasone-Salmeterol (ADVAIR) 250-50 MCG/DOSE AEPB Inhale 1 puff into the lungs 2 (two) times daily.   Yes Historical Provider, MD  glimepiride (AMARYL) 2 MG tablet Take 2 mg by mouth 2 (two) times daily.   Yes Historical Provider, MD  hydrochlorothiazide (MICROZIDE) 12.5 MG capsule Take 12.5 mg by mouth 2 (two) times daily.   Yes Historical Provider, MD  insulin glargine (LANTUS) 100 UNIT/ML injection Inject 30 Units into the skin at bedtime.   Yes Historical Provider, MD  insulin NPH-regular Human (NOVOLIN 70/30) (70-30) 100 UNIT/ML injection Inject 40-50 Units into the skin 2 (two) times daily with a meal. 50 units in am, 40 units in pm   Yes Historical Provider, MD  levothyroxine (SYNTHROID, LEVOTHROID) 175 MCG tablet Take 175 mcg by mouth daily before breakfast.   Yes Historical Provider, MD  metoprolol (LOPRESSOR) 100 MG tablet Take 100 mg by mouth 2 (two) times daily.   Yes Historical Provider, MD  simvastatin (ZOCOR) 40 MG tablet  Take 40 mg by mouth daily at 6 PM.   Yes Historical Provider, MD  zolpidem (AMBIEN) 10 MG tablet Take 10 mg by mouth at bedtime.   Yes Historical Provider, MD    Scheduled Meds: . cefTRIAXone (ROCEPHIN)  IV  1 g Intravenous Q24H  . insulin aspart  0-9 Units Subcutaneous 6 times per day  . sodium chloride  3 mL Intravenous Q12H   Infusions: . sodium chloride 75 mL/hr at 04/08/13 0004   PRN Meds: albuterol, alum & mag hydroxide-simeth, ondansetron (ZOFRAN) IV, ondansetron   Allergies as of 04/07/2013 - Review Complete 04/07/2013  Allergen Reaction Noted  . Avelox [moxifloxacin hcl in nacl] Hives, Itching, and Rash 04/07/2013  . Oxycontin [oxycodone hcl] Hives, Itching, and Rash 04/07/2013  . Penicillins Hives, Itching, and Rash 04/07/2013    History reviewed. No pertinent family history.  History   Social History  . Marital Status: Legally Separated    Spouse Name: N/A    Number of Children: N/A  . Years of Education: N/A   Occupational History  . Former CNA   Social History Main Topics  . Smoking status: Never Smoker   . Smokeless tobacco: Not on file  . Alcohol Use: None  . Drug Use: Not on file  . Sexual Activity: Not on file   Social History Narrative  . [employer] conservation [employer]    REVIEW OF SYSTEMS: Constitutional:  Weight gain ENT:  No nose bleeds Pulm:  No cough or wheezing currently CV:  No palpitations, no LE edema. Chemical stress test in last 2 years was unremarkable.  No angina GU:  No hematuria, no frequency GI:  Per HPI Heme:  Anemia during child bearing age but not in many years.  Never took Iron   Transfusions:  none Neuro:  No headaches, no peripheral tingling or numbness Derm:  No itching, no rash or sores.  Endocrine:  No sweats or chills.  No polyuria or dysuria Immunization:  Flu, pneumovax up to date Travel:  None beyond local counties in last few months.    PHYSICAL EXAM: Vital signs in last 24 hours: Filed Vitals:   04/08/13 0600    BP: 105/43  Pulse: 82  Temp: 98.6 F (37 C)  Resp: 16   Wt Readings from Last 3 Encounters:  04/08/13 117.3 kg (258 lb 9.6 oz)    General: obese, pleasant, comfortable AAF Head:  No asymmetry or trauma  Eyes:  No icterus or pallor Ears:  Not HOH  Nose:  No discharge or congestion Mouth:  Clear, moist, no lesions, no teeth.  Pharynx benign Neck:  No mass or JVD.  No bruits Lungs:  Clear to ausc bil  No whezzing or cough Heart: RRR.  No MRG Abdomen:  Soft, NT, ND.  No mass, no HSM.  Active BS.   Rectal: deferred   Musc/Skeltl: no joint contractures or swelling Extremities:  No CCE  Neurologic:  Pleasant, no tremor.  Oriented x 3.  No limb weakness Skin:  No rash, sores.  No telangectasia or palmar erythema Tattoos:  none Nodes:  No cervical adenopathy.    Psych:  Pleasant, relaxed.   Intake/Output from previous day:   Intake/Output this shift:    LAB RESULTS:  Recent Labs  04/07/13 1313 04/08/13 0140  WBC 10.8* 11.7*  HGB 14.2 12.1  HCT 42.9 36.8  PLT 331 295   BMET Lab Results  Component Value Date   NA 140 04/08/2013   NA 140 04/07/2013   K 3.5* 04/08/2013   K 3.7 04/07/2013   CL 102 04/08/2013   CL 100 04/07/2013   CO2 23 04/08/2013   CO2 25 04/07/2013   GLUCOSE 155* 04/08/2013   GLUCOSE 160* 04/07/2013   BUN 11 04/08/2013   BUN 13 04/07/2013   CREATININE 0.89 04/08/2013   CREATININE 1.12* 04/07/2013   CALCIUM 9.1 04/08/2013   CALCIUM 10.0 04/07/2013   LFT  Recent Labs  04/07/13 1313  PROT 8.1  ALBUMIN 4.0  AST 28  ALT 22  ALKPHOS 73  BILITOT 0.4   Lipase     Component Value Date/Time   LIPASE 23 04/07/2013 1313    RADIOLOGY STUDIES: Ct Head Wo Contrast 04/07/2013     FINDINGS: There is no evidence of acute intracranial hemorrhage, mass lesion, brain edema or extra-axial fluid collection. The ventricles and subarachnoid spaces are appropriately sized for age. There is no CT evidence of acute cortical infarction. There is a small lipoma associated  with the posterior aspect of the interhemispheric fissure. There is no generalized fat within the subarachnoid spaces.  There is partial opacification of the left maxillary sinus. The visualized paranasal sinuses and mastoid air cells are otherwise clear. The calvarium is intact.  IMPRESSION: 1. No acute intracranial findings. 2. Incidental small lipoma associated with the posterior aspect of the interhemispheric fissure. 3. Partial opacification of the left maxillary sinus.   Electronically Signed   By: Roxy HorsemanBill  Veazey M.D.   On: 04/07/2013 20:02    ENDOSCOPIC STUDIES: EGD ~ 2010 Found HH  Colonoscopy within last 18 months, found hemorrhoids. .  IMPRESSION:   *  Acute dysphagia. Rule out infectious esophagitis. Rule out dysmotility.  Rule out stricture.  Hx of same 5 years ago.  [facility]   On chronic BID PPI.   *  New onset vertigo.   *  IDDM.  Poorly controlled.     PLAN:     *  Esophagram this AM.  After this will allow full liquids. Possible EGD tomorrow.    Jennye MoccasinSarah Benji Poynter  04/08/2013, 9:24 AM Pager: [phone]

## 2013-04-09 ENCOUNTER — Encounter (HOSPITAL_COMMUNITY): Payer: Self-pay

## 2013-04-09 ENCOUNTER — Inpatient Hospital Stay (HOSPITAL_COMMUNITY): Payer: Medicare HMO

## 2013-04-09 ENCOUNTER — Encounter (HOSPITAL_COMMUNITY)
Admission: EM | Disposition: A | Discharge status: Home / Self Care | Payer: Self-pay | Source: Home / Self Care | Attending: Internal Medicine

## 2013-04-09 HISTORY — PX: ESOPHAGOGASTRODUODENOSCOPY: SHX5428

## 2013-04-09 LAB — BASIC METABOLIC PANEL
BUN: 10 mg/dL (ref 6–23)
CO2: 22 mEq/L (ref 19–32)
Calcium: 9.2 mg/dL (ref 8.4–10.5)
Chloride: 103 mEq/L (ref 96–112)
Creatinine, Ser: 0.94 mg/dL (ref 0.50–1.10)
GFR, EST AFRICAN AMERICAN: 78 mL/min — AB (ref >90)
GFR, EST NON AFRICAN AMERICAN: 67 mL/min — AB (ref >90)
GLUCOSE: 168 mg/dL — AB (ref 70–99)
Potassium: 4 mEq/L (ref 3.7–5.3)
SODIUM: 139 meq/L (ref 137–147)

## 2013-04-09 LAB — GLUCOSE, CAPILLARY
GLUCOSE-CAPILLARY: 133 mg/dL — AB (ref 70–99)
GLUCOSE-CAPILLARY: 158 mg/dL — AB (ref 70–99)
GLUCOSE-CAPILLARY: 164 mg/dL — AB (ref 70–99)
GLUCOSE-CAPILLARY: 202 mg/dL — AB (ref 70–99)
Glucose-Capillary: 164 mg/dL — ABNORMAL HIGH (ref 70–99)
Glucose-Capillary: 260 mg/dL — ABNORMAL HIGH (ref 70–99)

## 2013-04-09 SURGERY — EGD (ESOPHAGOGASTRODUODENOSCOPY)
Anesthesia: Moderate Sedation

## 2013-04-09 MED ORDER — FENTANYL CITRATE 0.05 MG/ML IJ SOLN
INTRAMUSCULAR | Status: AC
  Filled 2013-04-09: qty 2

## 2013-04-09 MED ORDER — SODIUM CHLORIDE 0.9 % IV SOLN: INTRAVENOUS | Status: DC

## 2013-04-09 MED ORDER — MIDAZOLAM HCL 10 MG/2ML IJ SOLN
INTRAMUSCULAR | Status: DC | PRN
  Administered 2013-04-09 (×4): 2 mg via INTRAVENOUS

## 2013-04-09 MED ORDER — MIDAZOLAM HCL 5 MG/ML IJ SOLN
INTRAMUSCULAR | Status: AC
  Filled 2013-04-09: qty 2

## 2013-04-09 MED ORDER — PHENOL 1.4 % MT LIQD
1.0000 | OROMUCOSAL | Status: DC | PRN
  Filled 2013-04-09: qty 177

## 2013-04-09 MED ORDER — FLUTICASONE PROPIONATE 50 MCG/ACT NA SUSP
1.0000 | Freq: Every day | NASAL | Status: DC
  Administered 2013-04-09 – 2013-04-11 (×2): 1 via NASAL
  Filled 2013-04-09: qty 16

## 2013-04-09 MED ORDER — FENTANYL CITRATE 0.05 MG/ML IJ SOLN
INTRAMUSCULAR | Status: DC | PRN
Start: 2013-04-09 — End: 2013-04-09
  Administered 2013-04-09 (×3): 25 ug via INTRAVENOUS

## 2013-04-09 MED ORDER — BUTAMBEN-TETRACAINE-BENZOCAINE 2-2-14 % EX AERO
INHALATION_SPRAY | CUTANEOUS | Status: DC | PRN
  Administered 2013-04-09: 2 via TOPICAL

## 2013-04-09 NOTE — Op Note (Signed)
[facility] [address] [place] [id number]   ENDOSCOPY PROCEDURE REPORT  PATIENT: Emily Dixon, Emily  MR#: [id number] BIRTHDATE: Mar 21, 1955 , 57  yrs. old GENDER: Female ENDOSCOPIST: Beverley FiedlerJay M Tniyah Nakagawa, MD REFERRED BY:  [facility] PROCEDURE DATE:  04/09/2013 PROCEDURE:  EGD w/ biopsy and Savary dilation of esophagus ASA CLASS:     Class II INDICATIONS:  Dysphagia. MEDICATIONS: These medications were titrated to patient response per physician's verbal order, Fentanyl 100 mcg IV, and Versed 10 mg IV  TOPICAL ANESTHETIC: Cetacaine Spray  DESCRIPTION OF PROCEDURE: After the risks benefits and alternatives of the procedure were thoroughly explained, informed consent was obtained.  The Pentax Gastroscope [id number] endoscope was introduced through the mouth and advanced to the second portion of the duodenum. Without limitations.  The instrument was slowly withdrawn as the mucosa was fully examined.  ESOPHAGUS: The mucosa of the esophagus appeared normal.  Multiple biopsies were taken in the distal and mid esophagus to rule out eosinophilic esophagitis.   A small, 2 cm, hiatal hernia was noted. Given symptoms empiric dilation was performed over a guidewire with a Savory dilator. 18 mm, one pass, mild resistance  STOMACH: The mucosa of the stomach appeared normal.  DUODENUM: The duodenal mucosa showed no abnormalities in the bulb and second portion of the duodenum.  Retroflexed views revealed a hiatal hernia.     The scope was then withdrawn from the patient and the procedure completed.  COMPLICATIONS: There were no complications.  ENDOSCOPIC IMPRESSION: 1.   The mucosa of the esophagus appeared normal; multiple biopsies were taken in the distal and mid esophagus to rule out eosinophilic esophagitis; empiric dilation with 18 mm Savory over guidewire (1 pass) 2.   2 cm hiatal hernia 3.   The mucosa of the stomach appeared normal 4.   The duodenal mucosa  showed no abnormalities in the bulb and second portion of the duodenum  RECOMMENDATIONS: 1.  Await biopsy results 2.  Continue taking your PPI (antiacid medicine) once daily.  It is best to be taken 20-30 minutes prior to breakfast meal. 3.  If biopsies unrevealing and dysphagia persists after empiric dilation, the next diagnostic study would be esophageal manometry eSigned:  Beverley FiedlerJay M Chanah Tidmore, MD 04/09/2013 12:15 PM    CC:The Patient

## 2013-04-09 NOTE — Progress Notes (Signed)
Went by to speak with patient about diabetes and outpatient regimen for diabetes control.  However patient is very drowsy from medications she received for endoscopy procedure earlier today. With A1C of 10.7%, recommend patient be referred for outpatient diabetes education (will go ahead and place order for outpatient education). According to the home medication list the patient takes Lantus 30 units QHS, 70/30 50 units in the am, 70/30 40 units in the pm, Bydureon 2 mg Qweek, and Amaryl 2 mg BID.  Currently patient is ordered Novolog 0-9 units Q4H which has kept blood glucose fairly controlled.  Will continue to follow while inpatient.  Thanks, Orlando PennerMarie Schylar Wuebker, RN, MSN, CCRN Diabetes Coordinator Inpatient Diabetes Program [phone] (Team Pager) [phone] (AP office) [phone] [facility] office)

## 2013-04-09 NOTE — Progress Notes (Signed)
Pt was assessed per RT protocol assessment, severity score was 3. Scheduled bronchodilators are not indicated at this time per severity score. Pt will be on home regimen of Albuterol 2.3mg  Q6PRN. RT will continue to monitor.

## 2013-04-09 NOTE — Progress Notes (Signed)
[facility] PROGRESS NOTE  Emily Dixon BJY:[id number] DOB: Apr 11, 1955 DOA: 04/07/2013 PCP: Pcp Not In System  Assessment/Plan: 1-Dysphagia; Barium swallow showed; Moderate gastroesophageal reflux. For endoscopy today. Resume diet after endoscopy depending on finding.   2-Dizziness, vertigo. MRI negative for acute stroke. DC telemetry. Vertigo resolved.   3-UTI; UA with positive nitrates. Ceftriaxone day 3.. Follow up urine culture.  4-Diabetes; SSI.  5-Asthma; Nebulizer treatments PRN.  6-Sinusitis, mastoiditis. Ceftriaxone should cover. Start Flonase.  7-Headaches; MRI negative. Suspect secondary to sinusitis.   Code Status: Full Code.  Family Communication: Care discussed with patient.  Disposition Plan: home when able to tolerates diet.    Consultants:  GI  Procedures: Barium swallow;  1. Moderate gastroesophageal reflux.  2. No evidence of esophageal stricture or significant dysmotility.  3. No pharyngeal abnormalities observed   Antibiotics:  Ceftriaxone 3-18  HPI/Subjective: No headaches. dizziness resolved.   Objective: Filed Vitals:   04/09/13 1155  BP: 124/50  Pulse: 93  Temp:   Resp: 15    Intake/Output Summary (Last 24 hours) at 04/09/13 1207 Last data filed at 04/08/13 1748  Gross per 24 hour  Intake     90 ml  Output      0 ml  Net     90 ml   Filed Weights   04/08/13 0013 04/09/13 0400  Weight: 117.3 kg (258 lb 9.6 oz) 119.886 kg (264 lb 4.8 oz)    Exam:   General: No distress.   Cardiovascular: S 1, S 2 RRR  Respiratory: CTA  Abdomen: BS present, soft.   Musculoskeletal: no edema.   Data Reviewed: Basic Metabolic Panel:  Recent Labs Lab 04/07/13 1313 04/08/13 0140 04/09/13 0540  NA 140 140 139  K 3.7 3.5* 4.0  CL 100 102 103  CO2 25 23 22   GLUCOSE 160* 155* 168*  BUN 13 11 10   CREATININE 1.12* 0.89 0.94  CALCIUM 10.0 9.1 9.2   Liver Function Tests:  Recent Labs Lab 04/07/13 1313  AST 28  ALT 22   ALKPHOS 73  BILITOT 0.4  PROT 8.1  ALBUMIN 4.0    Recent Labs Lab 04/07/13 1313  LIPASE 23   No results found for this basename: AMMONIA,  in the last 168 hours CBC:  Recent Labs Lab 04/07/13 1313 04/08/13 0140  WBC 10.8* 11.7*  NEUTROABS 4.2  --   HGB 14.2 12.1  HCT 42.9 36.8  MCV 79.4 78.8  PLT 331 295   Cardiac Enzymes: No results found for this basename: CKTOTAL, CKMB, CKMBINDEX, TROPONINI,  in the last 168 hours BNP (last 3 results) No results found for this basename: PROBNP,  in the last 8760 hours CBG:  Recent Labs Lab 04/08/13 1826 04/08/13 2003 04/09/13 0008 04/09/13 0424 04/09/13 0751  GLUCAP 214* 184* 158* 164* 133*    No results found for this or any previous visit (from the past 240 hour(s)).   Studies: Ct Head Wo Contrast  04/07/2013   CLINICAL DATA:  Dizziness with sore throat and leukocytosis.  EXAM: CT HEAD WITHOUT CONTRAST  TECHNIQUE: Contiguous axial images were obtained from the base of the skull through the vertex without intravenous contrast.  COMPARISON:  None.  FINDINGS: There is no evidence of acute intracranial hemorrhage, mass lesion, brain edema or extra-axial fluid collection. The ventricles and subarachnoid spaces are appropriately sized for age. There is no CT evidence of acute cortical infarction. There is a small lipoma associated with the posterior aspect of the interhemispheric fissure. There is  no generalized fat within the subarachnoid spaces.  There is partial opacification of the left maxillary sinus. The visualized paranasal sinuses and mastoid air cells are otherwise clear. The calvarium is intact.  IMPRESSION: 1. No acute intracranial findings. 2. Incidental small lipoma associated with the posterior aspect of the interhemispheric fissure. 3. Partial opacification of the left maxillary sinus.   Electronically Signed   By: Roxy HorsemanBill  Veazey M.D.   On: 04/07/2013 20:02   Mr Brain Wo Contrast  04/09/2013   CLINICAL DATA:  58 year old  female with dizziness. Dysphagia, vertigo. Initial encounter. Query infarct.  EXAM: MRI HEAD WITHOUT CONTRAST  TECHNIQUE: Multiplanar, multiecho pulse sequences of the brain and surrounding structures were obtained without intravenous contrast.  COMPARISON:  Head CT without contrast 04/07/2013.  FINDINGS: Cerebral volume is normal. Major intracranial vascular flow voids are preserved. No restricted diffusion to suggest acute infarction. No midline shift, mass effect, evidence of mass lesion, ventriculomegaly, extra-axial collection or acute intracranial hemorrhage. Cervicomedullary junction and pituitary are within normal limits.  Small incidental parafalcine CNS lipoma re-identified (series 9, image 19).  Subtle small subcortical white matter areas of T2 and FLAIR hyperintensity. Superimposed increased perivascular spaces in some of the cerebral white matter. No cortical encephalomalacia. Deep gray matter nuclei, brainstem and cerebellum are within normal limits.  Visible internal auditory structures appear normal. Trace right mastoid effusion. Negative visualized nasopharynx except for adenoid hypertrophy. Left mastoids are clear.  Paranasal sinus mucosal thickening. Moderate fluid level in the left maxillary sinus. Visualized orbit soft tissues are within normal limits. Visualized scalp soft tissues are within normal limits.  Multilevel chronic disc and endplate degeneration in the visible cervical spine. Visualized bone marrow signal is within normal limits.  IMPRESSION: 1. No acute intracranial abnormality. Normal for age non contrast MRI appearance of the brain. 2. Paranasal sinus inflammation with left maxillary fluid suggesting acute sinusitis. Trace right mastoid effusion.   Electronically Signed   By: Augusto GambleLee  Hall M.D.   On: 04/09/2013 09:01   Dg Esophagus  04/08/2013   CLINICAL DATA:  Dysphagia with regurgitation. History of partial thyroidectomy. Evaluate for stricture or dysmotility.  EXAM: ESOPHOGRAM /  BARIUM SWALLOW / BARIUM TABLET STUDY  TECHNIQUE: Combined double contrast and single contrast examination performed using effervescent crystals, thick barium liquid, and thin barium liquid. The patient was observed with fluoroscopy swallowing a 13mm barium sulphate tablet.  FLUOROSCOPY TIME:  36 seconds.  COMPARISON:  None.  FINDINGS: The esophageal motility appears within normal limits. Rapid sequence imaging of the pharynx in the AP and lateral projections demonstrates no mucosal abnormalities. There are surgical clips in the lower left neck consistent with prior thyroid surgery. Multilevel cervical spondylosis is noted without significant extrinsic compression on the cervical esophagus.  The thoracic esophagus demonstrates no mucosal ulceration, stricture or mass. There is a small reducible hiatal hernia and significant reflux to the level of the carina was noted with the water siphon test. A 13 mm barium tablet was administered and passed without delay into the stomach.  IMPRESSION: 1. Moderate gastroesophageal reflux. 2. No evidence of esophageal stricture or significant dysmotility. 3. No pharyngeal abnormalities observed.   Electronically Signed   By: Roxy HorsemanBill  Veazey M.D.   On: 04/08/2013 12:22    Scheduled Meds: . [MAR HOLD] cefTRIAXone (ROCEPHIN)  IV  1 g Intravenous Q24H  . [MAR HOLD] insulin aspart  0-9 Units Subcutaneous 6 times per day  . [facility][MAR HOLD] sodium chloride  3 mL Intravenous Q12H   Continuous Infusions: .  sodium chloride      Principal Problem:   Dysphagia Active Problems:   Diabetes mellitus without complication   High cholesterol   Hypertension   Vertigo   Status post dilation of esophageal narrowing   UTI (lower urinary tract infection)    Time spent: 25 minutes.     Hartley Barefoot A  [facility] Pager [phone]. If 7PM-7AM, please contact night-coverage at [email or web address], password [email or web address] 04/09/2013, 12:07 PM  LOS: 2 days

## 2013-04-10 LAB — BASIC METABOLIC PANEL
BUN: 8 mg/dL (ref 6–23)
CO2: 24 meq/L (ref 19–32)
CREATININE: 0.89 mg/dL (ref 0.50–1.10)
Calcium: 9 mg/dL (ref 8.4–10.5)
Chloride: 101 mEq/L (ref 96–112)
GFR calc non Af Amer: 72 mL/min — ABNORMAL LOW (ref >90)
GFR, EST AFRICAN AMERICAN: 83 mL/min — AB (ref >90)
Glucose, Bld: 242 mg/dL — ABNORMAL HIGH (ref 70–99)
Potassium: 4 mEq/L (ref 3.7–5.3)
SODIUM: 138 meq/L (ref 137–147)

## 2013-04-10 LAB — URINE CULTURE: Colony Count: >=100000

## 2013-04-10 LAB — GLUCOSE, CAPILLARY
GLUCOSE-CAPILLARY: 221 mg/dL — AB (ref 70–99)
GLUCOSE-CAPILLARY: 289 mg/dL — AB (ref 70–99)
Glucose-Capillary: 202 mg/dL — ABNORMAL HIGH (ref 70–99)
Glucose-Capillary: 317 mg/dL — ABNORMAL HIGH (ref 70–99)

## 2013-04-10 LAB — CBC
HCT: 35.1 % — ABNORMAL LOW (ref 36.0–46.0)
Hemoglobin: 11.4 g/dL — ABNORMAL LOW (ref 12.0–15.0)
MCH: 25.9 pg — ABNORMAL LOW (ref 26.0–34.0)
MCHC: 32.5 g/dL (ref 30.0–36.0)
MCV: 79.6 fL (ref 78.0–100.0)
PLATELETS: 267 10*3/uL (ref 150–400)
RBC: 4.41 MIL/uL (ref 3.87–5.11)
RDW: 14.9 % (ref 11.5–15.5)
WBC: 10.4 10*3/uL (ref 4.0–10.5)

## 2013-04-10 MED ORDER — PANTOPRAZOLE SODIUM 40 MG IV SOLR
40.0000 mg | Freq: Once | INTRAVENOUS | Status: AC
  Administered 2013-04-11: 40 mg via INTRAVENOUS
  Filled 2013-04-10: qty 40

## 2013-04-10 MED ORDER — DOCUSATE SODIUM 283 MG RE ENEM
1.0000 | ENEMA | Freq: Once | RECTAL | Status: AC
  Administered 2013-04-11: 283 mg via RECTAL
  Filled 2013-04-10: qty 1

## 2013-04-10 MED ORDER — PANTOPRAZOLE SODIUM 40 MG PO TBEC
40.0000 mg | DELAYED_RELEASE_TABLET | Freq: Two times a day (BID) | ORAL | Status: DC
  Administered 2013-04-10 – 2013-04-11 (×3): 40 mg via ORAL
  Filled 2013-04-10 (×2): qty 1

## 2013-04-10 NOTE — Progress Notes (Signed)
[facility] PROGRESS NOTE  Emily Dixon VWU:[id number] DOB: 11-20-1955 DOA: 04/07/2013 PCP: Pcp Not In System  Assessment/Plan:  1-Dysphagia; Barium swallow showed; Moderate gastroesophageal reflux. Endoscopy only showed 2 Cm hiatal hernia. Empirically dilation was performed. Start protonix.  Soft diet.   2-Dizziness, vertigo. MRI negative for acute stroke. DC telemetry. Vertigo resolved.   3-UTI; UA with positive nitrates. Ceftriaxone day 4/7. Urine culture grew E coli.  4-Diabetes; SSI.  5-Asthma; Nebulizer treatments PRN.  6-Sinusitis, mastoiditis. Ceftriaxone should cover. Start Flonase.  7-Headaches; MRI negative. Suspect secondary to sinusitis.   Code Status: Full Code.  Family Communication: Care discussed with patient.  Disposition Plan: home when able to tolerates diet.    Consultants:  GI  Procedures: Barium swallow;  1. Moderate gastroesophageal reflux.  2. No evidence of esophageal stricture or significant dysmotility.  3. No pharyngeal abnormalities observed   Antibiotics:  Ceftriaxone 3-18  HPI/Subjective: Alert and awake. Ate less than half of breakfast.   Objective: Filed Vitals:   04/10/13 1428  BP: 96/62  Pulse: 92  Temp: 99.1 F (37.3 C)  Resp: 20    Intake/Output Summary (Last 24 hours) at 04/10/13 1501 Last data filed at 04/10/13 1303  Gross per 24 hour  Intake    493 ml  Output      1 ml  Net    492 ml   Filed Weights   04/08/13 0013 04/09/13 0400  Weight: 117.3 kg (258 lb 9.6 oz) 119.886 kg (264 lb 4.8 oz)    Exam:   General: No distress.   Cardiovascular: S 1, S 2 RRR  Respiratory: CTA  Abdomen: BS present, soft.   Musculoskeletal: no edema.   Data Reviewed: Basic Metabolic Panel:  Recent Labs Lab 04/07/13 1313 04/08/13 0140 04/09/13 0540 04/10/13 1000  NA 140 140 139 138  K 3.7 3.5* 4.0 4.0  CL 100 102 103 101  CO2 25 23 22 24   GLUCOSE 160* 155* 168* 242*  BUN 13 11 10 8   CREATININE 1.12* 0.89 0.94  0.89  CALCIUM 10.0 9.1 9.2 9.0   Liver Function Tests:  Recent Labs Lab 04/07/13 1313  AST 28  ALT 22  ALKPHOS 73  BILITOT 0.4  PROT 8.1  ALBUMIN 4.0    Recent Labs Lab 04/07/13 1313  LIPASE 23   No results found for this basename: AMMONIA,  in the last 168 hours CBC:  Recent Labs Lab 04/07/13 1313 04/08/13 0140 04/10/13 1000  WBC 10.8* 11.7* 10.4  NEUTROABS 4.2  --   --   HGB 14.2 12.1 11.4*  HCT 42.9 36.8 35.1*  MCV 79.4 78.8 79.6  PLT 331 295 267   Cardiac Enzymes: No results found for this basename: CKTOTAL, CKMB, CKMBINDEX, TROPONINI,  in the last 168 hours BNP (last 3 results) No results found for this basename: PROBNP,  in the last 8760 hours CBG:  Recent Labs Lab 04/09/13 1314 04/09/13 1642 04/09/13 2002 04/10/13 0844 04/10/13 1227  GLUCAP 164* 202* 260* 202* 221*    Recent Results (from the past 240 hour(s))  URINE CULTURE     Status: None   Collection Time    04/07/13  6:57 PM      Result Value Ref Range Status   Specimen Description URINE, CLEAN CATCH   Final   Special Requests ADDED [id number]   Final   Culture  Setup Time     Final   Value: 04/09/2013 00:43     Performed at [facility]  Colony Count     Final   Value: >=100,000 COLONIES/ML     [facility]   Culture     Final   Value: ESCHERICHIA COLI     [facility]   Report Status 04/10/2013 FINAL   Final   Organism ID, Bacteria ESCHERICHIA COLI   Final     Studies: Mr Brain Wo Contrast  04/09/2013   CLINICAL DATA:  58 year old female with dizziness. Dysphagia, vertigo. Initial encounter. Query infarct.  EXAM: MRI HEAD WITHOUT CONTRAST  TECHNIQUE: Multiplanar, multiecho pulse sequences of the brain and surrounding structures were obtained without intravenous contrast.  COMPARISON:  Head CT without contrast 04/07/2013.  FINDINGS: Cerebral volume is normal. Major intracranial vascular flow voids are preserved. No restricted  diffusion to suggest acute infarction. No midline shift, mass effect, evidence of mass lesion, ventriculomegaly, extra-axial collection or acute intracranial hemorrhage. Cervicomedullary junction and pituitary are within normal limits.  Small incidental parafalcine CNS lipoma re-identified (series 9, image 19).  Subtle small subcortical white matter areas of T2 and FLAIR hyperintensity. Superimposed increased perivascular spaces in some of the cerebral white matter. No cortical encephalomalacia. Deep gray matter nuclei, brainstem and cerebellum are within normal limits.  Visible internal auditory structures appear normal. Trace right mastoid effusion. Negative visualized nasopharynx except for adenoid hypertrophy. Left mastoids are clear.  Paranasal sinus mucosal thickening. Moderate fluid level in the left maxillary sinus. Visualized orbit soft tissues are within normal limits. Visualized scalp soft tissues are within normal limits.  Multilevel chronic disc and endplate degeneration in the visible cervical spine. Visualized bone marrow signal is within normal limits.  IMPRESSION: 1. No acute intracranial abnormality. Normal for age non contrast MRI appearance of the brain. 2. Paranasal sinus inflammation with left maxillary fluid suggesting acute sinusitis. Trace right mastoid effusion.   Electronically Signed   By: Augusto GambleLee  Hall M.D.   On: 04/09/2013 09:01    Scheduled Meds: . cefTRIAXone (ROCEPHIN)  IV  1 g Intravenous Q24H  . fluticasone  1 spray Each Nare Daily  . insulin aspart  0-9 Units Subcutaneous 6 times per day  . pantoprazole  40 mg Oral BID  . sodium chloride  3 mL Intravenous Q12H   Continuous Infusions: . sodium chloride      Principal Problem:   Dysphagia Active Problems:   Diabetes mellitus without complication   High cholesterol   Hypertension   Vertigo   Status post dilation of esophageal narrowing   UTI (lower urinary tract infection)    Time spent: 25 minutes.      Hartley Barefootegalado, Shantara Goosby A  [facility] Pager [phone]. If 7PM-7AM, please contact night-coverage at [email or web address], password [email or web address] 04/10/2013, 3:01 PM  LOS: 3 days

## 2013-04-10 NOTE — Progress Notes (Signed)
Pt refused to have her morning labs drawn. On-call MD made aware. Sanda LingerMilam, Dallas Scorsone R

## 2013-04-11 LAB — GLUCOSE, CAPILLARY
GLUCOSE-CAPILLARY: 231 mg/dL — AB (ref 70–99)
Glucose-Capillary: 207 mg/dL — ABNORMAL HIGH (ref 70–99)
Glucose-Capillary: 213 mg/dL — ABNORMAL HIGH (ref 70–99)

## 2013-04-11 MED ORDER — PANTOPRAZOLE SODIUM 40 MG PO TBEC: 40.0000 mg | DELAYED_RELEASE_TABLET | Freq: Two times a day (BID) | ORAL | Status: AC

## 2013-04-11 MED ORDER — ESOMEPRAZOLE MAGNESIUM 40 MG PO CPDR: 40.0000 mg | DELAYED_RELEASE_CAPSULE | Freq: Every day | ORAL | Status: DC

## 2013-04-11 MED ORDER — INSULIN GLARGINE 100 UNIT/ML ~~LOC~~ SOLN: 20.0000 [IU] | Freq: Every day | SUBCUTANEOUS | Status: DC

## 2013-04-11 MED ORDER — FLUCONAZOLE 200 MG PO TABS: 200.0000 mg | ORAL_TABLET | Freq: Every day | ORAL | Status: DC

## 2013-04-11 MED ORDER — FLUTICASONE PROPIONATE 50 MCG/ACT NA SUSP: 1.0000 | Freq: Every day | NASAL | Status: AC

## 2013-04-11 MED ORDER — INSULIN GLARGINE 100 UNIT/ML ~~LOC~~ SOLN: 25.0000 [IU] | Freq: Every day | SUBCUTANEOUS | Status: DC

## 2013-04-11 NOTE — Discharge Summary (Signed)
Physician Discharge Summary  Emily Dixon UJW:[id number] DOB: 06/20/1955 DOA: 04/07/2013  PCP: Pcp Not In System  Admit date: 04/07/2013 Discharge date: 04/11/2013  Time spent: 35 minutes  Recommendations for Outpatient Follow-up:  1. Needs to follow up with primary gastroenterologist, if dysphagia persist , patient will need Manometry.  2. Needs adjustment of insulin regimen.   Discharge Diagnoses:    Dysphagia   UTI (lower urinary tract infection)   Sinusitis.    Diabetes mellitus without complication   High cholesterol   Hypertension   Vertigo   Status post dilation of esophageal narrowing    Discharge Condition: Stable.   Diet recommendation: Carb Modified.   Filed Weights   04/08/13 0013 04/09/13 0400  Weight: 117.3 kg (258 lb 9.6 oz) 119.886 kg (264 lb 4.8 oz)    History of present illness:  58 yo female with sudden onset 4 days ago of dysphagia with solids and liquids along with vertigo. She has never had vertigo before. She has had some esophagus problem over 5 years ago where she was having trouble swallowing, she had an egd done and was told she had a high hiatal hernia, does not know if she had dilation then, but her problem resolved after the egd. She has been vomiting everything up, she feels like things are getting stuck esp solids. Also has been having persistent vertigo worse with movement but still present when lying still. No fevers. No cp. No sob. 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 No drooling, no problem with moving any extremities. No slurred speech. Again no h/o vertigo before. She also denies any dysuria, change in urinary frequency or any incontinence. No foul smelling urine. Have been asked to admit for uti, vertigo, and dysphagia.  Also her glucose is always over 250 per pt report, with poorly controlled dm.   Hospital Course:  1-Dysphagia; Barium swallow showed; Moderate gastroesophageal reflux. Endoscopy only showed 2 Cm hiatal  hernia. Empirically dilation was performed. Continue with  protonix. Soft diet. Tolerating diet. If problem persist reoccurs she will need manometry study.   2-Dizziness, vertigo. MRI negative for acute stroke. DC telemetry. Vertigo resolved.   3-UTI; UA with positive nitrates. Received 5 days of antibiotics. . Urine culture grew E coli.   4-Diabetes; SSI. Resume lantus at discharge. Hold 70/30. Needs to follow up with PCP for further adjustment.   5-Asthma; Nebulizer treatments PRN.   6-Sinusitis, mastoiditis. Ceftriaxone should cover. Flonase.   7-Headaches; MRI negative. Suspect secondary to sinusitis. Resolved.    Procedures: Barium swallow;  1. Moderate gastroesophageal reflux.  2. No evidence of esophageal stricture or significant dysmotility.  3. No pharyngeal abnormalities observed  Endoscopy; 2 Cm hiatal hernia. Empirically dilation was performed   Consultations:  GI  Discharge Exam: Filed Vitals:   04/11/13 0500  BP: 114/48  Pulse: 86  Temp: 97.9 F (36.6 C)  Resp: 18    General: No distress.  Cardiovascular: S 1, S 2 RRR Respiratory: CTA  Discharge Instructions  Discharge Orders   Future Orders Complete By Expires   Ambulatory referral to Nutrition and Diabetic Education  As directed    Comments:     A1C 10.7% on 04/08/13   Diet Carb Modified  As directed    Increase activity slowly  As directed        Medication List    STOP taking these medications       hydrochlorothiazide 12.5 MG capsule  Commonly known as:  MICROZIDE  insulin NPH-regular Human (70-30) 100 UNIT/ML injection  Commonly known as:  NOVOLIN 70/30      TAKE these medications       albuterol 108 (90 BASE) MCG/ACT inhaler  Commonly known as:  PROVENTIL HFA;VENTOLIN HFA  Inhale 2 puffs into the lungs every 6 (six) hours as needed for wheezing or shortness of breath.     albuterol (2.5 MG/3ML) 0.083% nebulizer solution  Commonly known as:  PROVENTIL  Take 2.5 mg by  nebulization every 6 (six) hours as needed for wheezing or shortness of breath.     BYDUREON 2 MG Pen  Generic drug:  Exenatide ER  Inject 1 each as directed once a week.     docusate sodium 100 MG capsule  Commonly known as:  COLACE  Take 100 mg by mouth 2 (two) times daily.     esomeprazole 40 MG capsule  Commonly known as:  NEXIUM  Take 1 capsule (40 mg total) by mouth daily.     fluticasone 50 MCG/ACT nasal spray  Commonly known as:  FLONASE  Place 1 spray into both nostrils daily.     Fluticasone-Salmeterol 250-50 MCG/DOSE Aepb  Commonly known as:  ADVAIR  Inhale 1 puff into the lungs 2 (two) times daily.     glimepiride 2 MG tablet  Commonly known as:  AMARYL  Take 2 mg by mouth 2 (two) times daily.     insulin glargine 100 UNIT/ML injection  Commonly known as:  LANTUS  Inject 0.2 mLs (20 Units total) into the skin at bedtime.     levothyroxine 175 MCG tablet  Commonly known as:  SYNTHROID, LEVOTHROID  Take 175 mcg by mouth daily before breakfast.     metoprolol 100 MG tablet  Commonly known as:  LOPRESSOR  Take 100 mg by mouth 2 (two) times daily.     simvastatin 40 MG tablet  Commonly known as:  ZOCOR  Take 40 mg by mouth daily at 6 PM.     zolpidem 10 MG tablet  Commonly known as:  AMBIEN  Take 10 mg by mouth at bedtime.       Allergies  Allergen Reactions  . Avelox [Moxifloxacin Hcl In Nacl] Hives, Itching and Rash  . Oxycontin [Oxycodone Hcl] Hives, Itching and Rash  . Penicillins Hives, Itching and Rash  . Tylenol [Acetaminophen]     Issues with her blood & liver per pt       Follow-up Information   Follow up with Pcp Not In System. (follow up with primary PCP and gastreoenterologist)        The results of significant diagnostics from this hospitalization (including imaging, microbiology, ancillary and laboratory) are listed below for reference.    Significant Diagnostic Studies: Ct Head Wo Contrast  04/07/2013   CLINICAL DATA:   Dizziness with sore throat and leukocytosis.  EXAM: CT HEAD WITHOUT CONTRAST  TECHNIQUE: Contiguous axial images were obtained from the base of the skull through the vertex without intravenous contrast.  COMPARISON:  None.  FINDINGS: There is no evidence of acute intracranial hemorrhage, mass lesion, brain edema or extra-axial fluid collection. The ventricles and subarachnoid spaces are appropriately sized for age. There is no CT evidence of acute cortical infarction. There is a small lipoma associated with the posterior aspect of the interhemispheric fissure. There is no generalized fat within the subarachnoid spaces.  There is partial opacification of the left maxillary sinus. The visualized paranasal sinuses and mastoid air cells are otherwise clear. The calvarium  is intact.  IMPRESSION: 1. No acute intracranial findings. 2. Incidental small lipoma associated with the posterior aspect of the interhemispheric fissure. 3. Partial opacification of the left maxillary sinus.   Electronically Signed   By: Roxy Horseman M.D.   On: 04/07/2013 20:02   Mr Brain Wo Contrast  04/09/2013   CLINICAL DATA:  58 year old female with dizziness. Dysphagia, vertigo. Initial encounter. Query infarct.  EXAM: MRI HEAD WITHOUT CONTRAST  TECHNIQUE: Multiplanar, multiecho pulse sequences of the brain and surrounding structures were obtained without intravenous contrast.  COMPARISON:  Head CT without contrast 04/07/2013.  FINDINGS: Cerebral volume is normal. Major intracranial vascular flow voids are preserved. No restricted diffusion to suggest acute infarction. No midline shift, mass effect, evidence of mass lesion, ventriculomegaly, extra-axial collection or acute intracranial hemorrhage. Cervicomedullary junction and pituitary are within normal limits.  Small incidental parafalcine CNS lipoma re-identified (series 9, image 19).  Subtle small subcortical white matter areas of T2 and FLAIR hyperintensity. Superimposed increased  perivascular spaces in some of the cerebral white matter. No cortical encephalomalacia. Deep gray matter nuclei, brainstem and cerebellum are within normal limits.  Visible internal auditory structures appear normal. Trace right mastoid effusion. Negative visualized nasopharynx except for adenoid hypertrophy. Left mastoids are clear.  Paranasal sinus mucosal thickening. Moderate fluid level in the left maxillary sinus. Visualized orbit soft tissues are within normal limits. Visualized scalp soft tissues are within normal limits.  Multilevel chronic disc and endplate degeneration in the visible cervical spine. Visualized bone marrow signal is within normal limits.  IMPRESSION: 1. No acute intracranial abnormality. Normal for age non contrast MRI appearance of the brain. 2. Paranasal sinus inflammation with left maxillary fluid suggesting acute sinusitis. Trace right mastoid effusion.   Electronically Signed   By: Augusto Gamble M.D.   On: 04/09/2013 09:01   Dg Esophagus  04/08/2013   CLINICAL DATA:  Dysphagia with regurgitation. History of partial thyroidectomy. Evaluate for stricture or dysmotility.  EXAM: ESOPHOGRAM / BARIUM SWALLOW / BARIUM TABLET STUDY  TECHNIQUE: Combined double contrast and single contrast examination performed using effervescent crystals, thick barium liquid, and thin barium liquid. The patient was observed with fluoroscopy swallowing a 13mm barium sulphate tablet.  FLUOROSCOPY TIME:  36 seconds.  COMPARISON:  None.  FINDINGS: The esophageal motility appears within normal limits. Rapid sequence imaging of the pharynx in the AP and lateral projections demonstrates no mucosal abnormalities. There are surgical clips in the lower left neck consistent with prior thyroid surgery. Multilevel cervical spondylosis is noted without significant extrinsic compression on the cervical esophagus.  The thoracic esophagus demonstrates no mucosal ulceration, stricture or mass. There is a small reducible hiatal  hernia and significant reflux to the level of the carina was noted with the water siphon test. A 13 mm barium tablet was administered and passed without delay into the stomach.  IMPRESSION: 1. Moderate gastroesophageal reflux. 2. No evidence of esophageal stricture or significant dysmotility. 3. No pharyngeal abnormalities observed.   Electronically Signed   By: Roxy Horseman M.D.   On: 04/08/2013 12:22    Microbiology: Recent Results (from the past 240 hour(s))  URINE CULTURE     Status: None   Collection Time    04/07/13  6:57 PM      Result Value Ref Range Status   Specimen Description URINE, [facility]   Final   Special Requests ADDED [id number]   Final   Culture  Setup Time     Final   Value: 04/09/2013  00:43     Performed at [facility]   Colony Count     Final   Value: >=100,000 COLONIES/ML     [facility]formed at [facility]   Culture     Final   Value: ESCHERICHIA COLI     Performed at [facility]   Report Status 04/10/2013 FINAL   Final   Organism ID, Bacteria ESCHERICHIA COLI   Final     Labs: Basic Metabolic Panel:  Recent Labs Lab 04/07/13 1313 04/08/13 0140 04/09/13 0540 04/10/13 1000  NA 140 140 139 138  K 3.7 3.5* 4.0 4.0  CL 100 102 103 101  CO2 25 23 22 24   GLUCOSE 160* 155* 168* 242*  BUN 13 11 10 8   CREATININE 1.12* 0.89 0.94 0.89  CALCIUM 10.0 9.1 9.2 9.0   Liver Function Tests:  Recent Labs Lab 04/07/13 1313  AST 28  ALT 22  ALKPHOS 73  BILITOT 0.4  PROT 8.1  ALBUMIN 4.0    Recent Labs Lab 04/07/13 1313  LIPASE 23   No results found for this basename: AMMONIA,  in the last 168 hours CBC:  Recent Labs Lab 04/07/13 1313 04/08/13 0140 04/10/13 1000  WBC 10.8* 11.7* 10.4  NEUTROABS 4.2  --   --   HGB 14.2 12.1 11.4*  HCT 42.9 36.8 35.1*  MCV 79.4 78.8 79.6  PLT 331 295 267   Cardiac Enzymes: No results found for this basename: CKTOTAL, CKMB, CKMBINDEX, TROPONINI,  in the last 168 hours BNP: BNP (last  3 results) No results found for this basename: PROBNP,  in the last 8760 hours CBG:  Recent Labs Lab 04/10/13 1706 04/10/13 2024 04/11/13 0034 04/11/13 0506 04/11/13 0757  GLUCAP 289* 317* 213* 207* 231*       Signed:  Maryn Freelove A  [facility] 04/11/2013, 9:13 AM

## 2013-04-12 ENCOUNTER — Encounter (HOSPITAL_COMMUNITY): Payer: Self-pay | Admitting: Internal Medicine

## 2013-04-15 ENCOUNTER — Encounter: Payer: Self-pay | Admitting: Internal Medicine

## 2016-04-30 ENCOUNTER — Other Ambulatory Visit: Payer: Self-pay | Admitting: Otolaryngology

## 2016-04-30 DIAGNOSIS — J329 Chronic sinusitis, unspecified: Secondary | ICD-10-CM

## 2016-05-02 ENCOUNTER — Ambulatory Visit
Admission: RE | Admit: 2016-05-02 | Discharge: 2016-05-02 | Disposition: A | Discharge status: Home / Self Care | Payer: 59 | Source: Ambulatory Visit | Attending: Otolaryngology | Admitting: Otolaryngology

## 2016-05-02 DIAGNOSIS — J329 Chronic sinusitis, unspecified: Secondary | ICD-10-CM

## 2018-08-07 IMAGING — CT CT MAXILLOFACIAL W/O CM
3 series · 16 of 37 positions shown, 19 images · non-contrast
Comparison: None.

CLINICAL DATA: Sinus congestion, pain and pressure over the last 3
years.

EXAM:
CT MAXILLOFACIAL WITHOUT CONTRAST
TECHNIQUE: Multidetector CT imaging of the maxillofacial structures was
performed. Multiplanar CT image reconstructions were also generated.
A small metallic BB was placed on the right temple in order to
reliably differentiate right from left.

[Series 3: axial soft 1.25 · axial · 0.49mm/px · z∈[-58,-1]mm · 4 of 138 slices shown]
[im 16/138  brain]
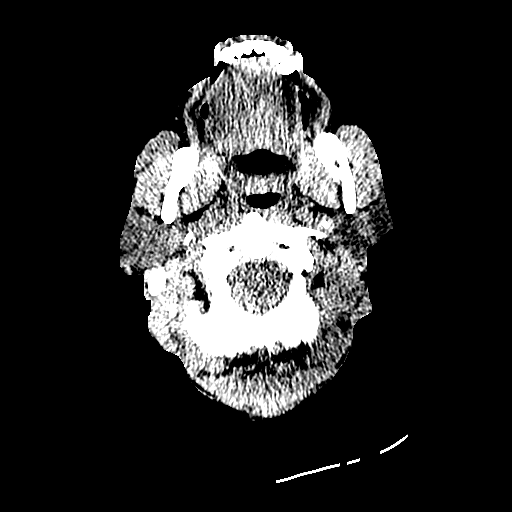
[im 31/138  brain]
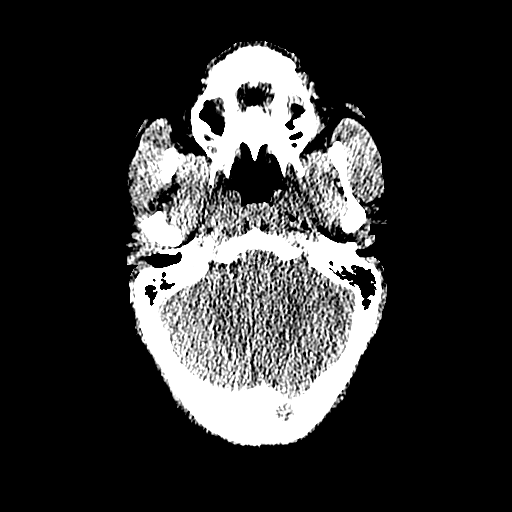
[im 46/138  brain]
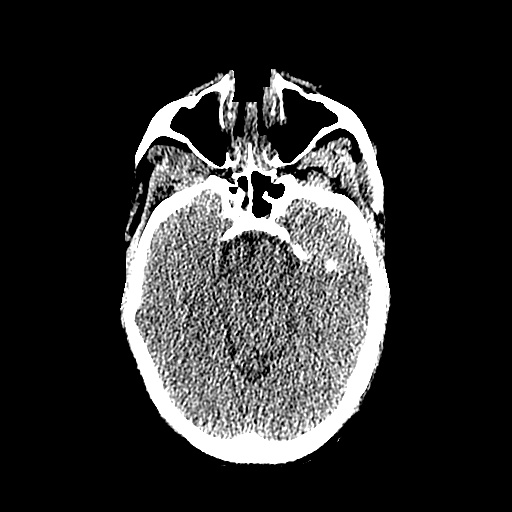
[im 61/138  brain]
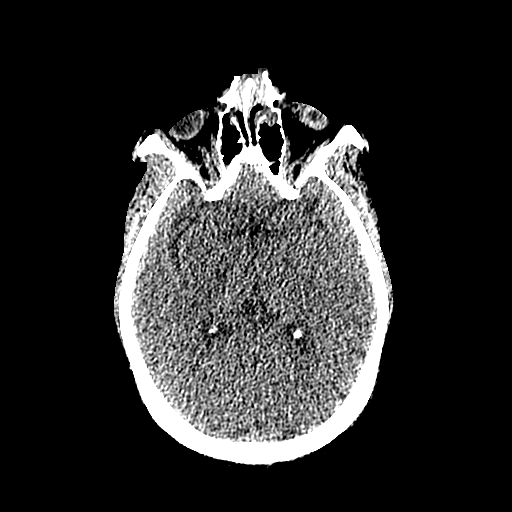

[Series 601: coronal facial · coronal · 0.49mm/px · 3 of 112 slices shown]
[im 38/112  bone]
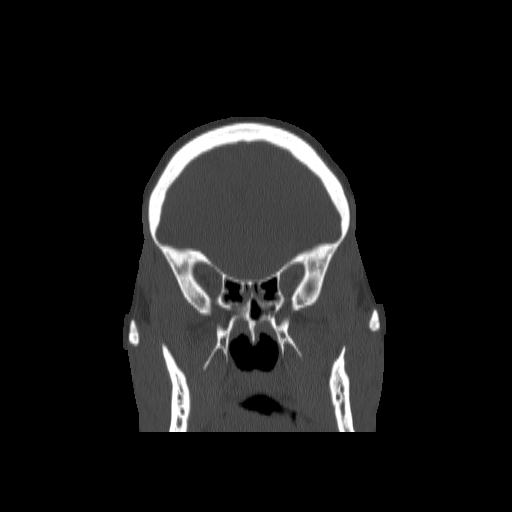
[im 56/112  bone]
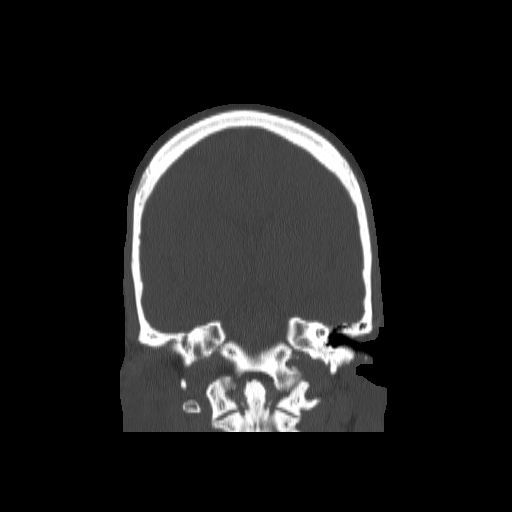
[im 75/112  bone]
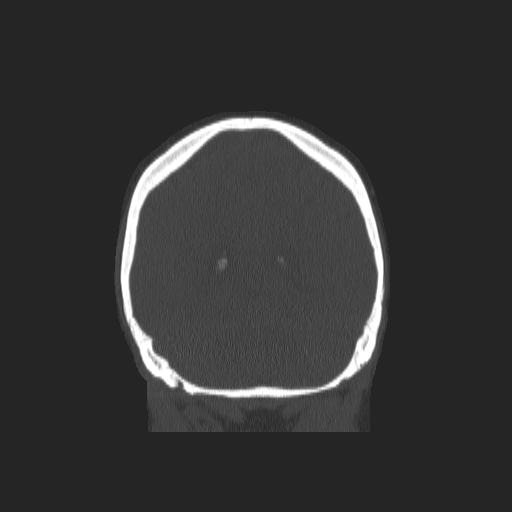

[Series 602: sagittal facial · sagittal · 0.49mm/px · 9 of 82 slices shown, 12 images]
[im 9/82  brain]
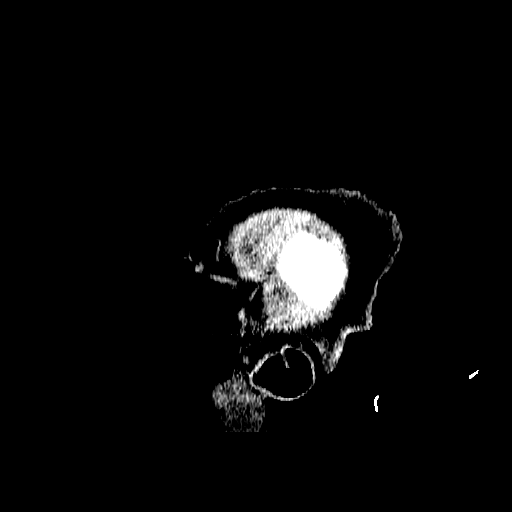
[im 9/82  bone]
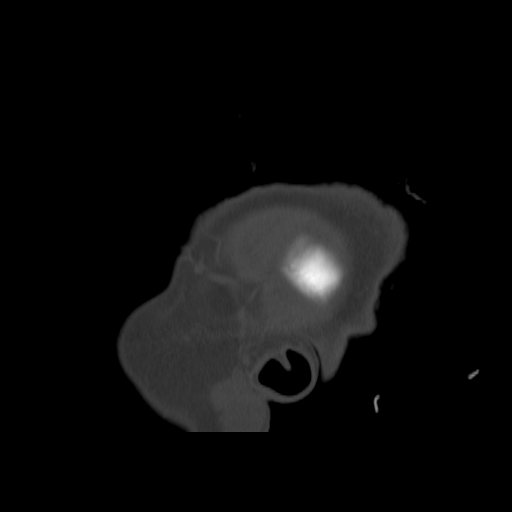
[im 17/82  bone]
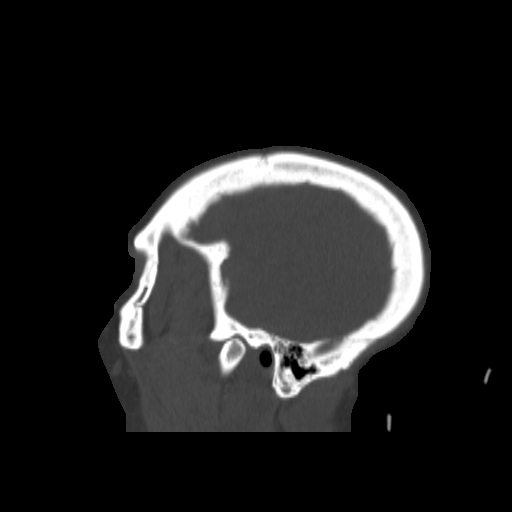
[im 25/82  bone]
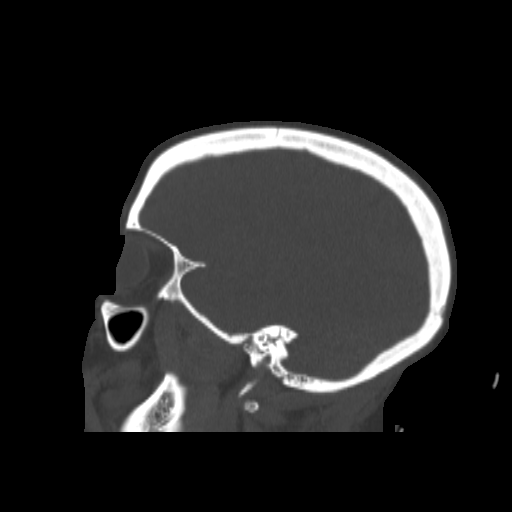
[im 33/82  bone]
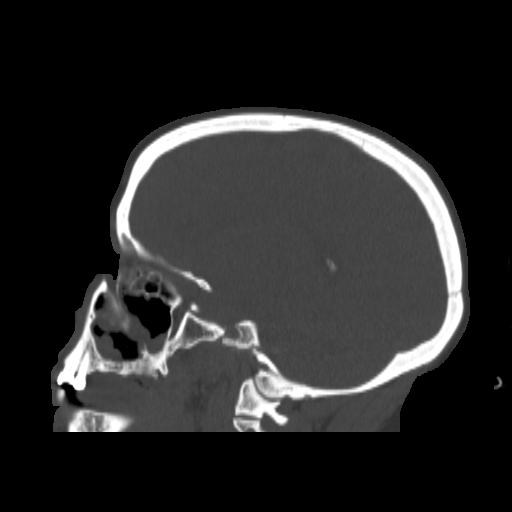
[im 41/82  brain]
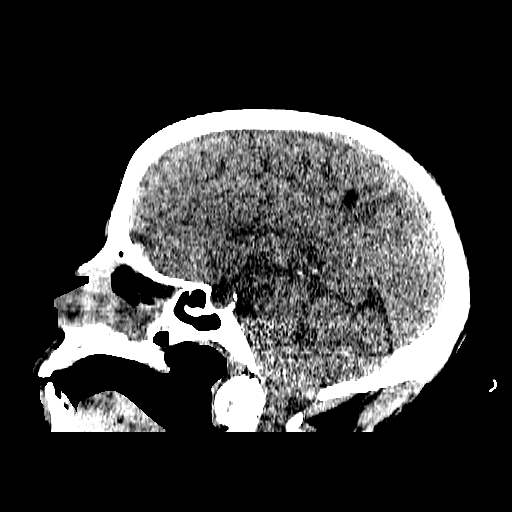
[im 41/82  bone]
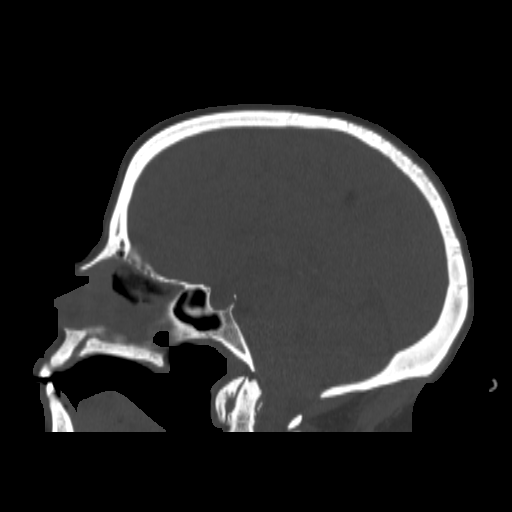
[im 49/82  bone]
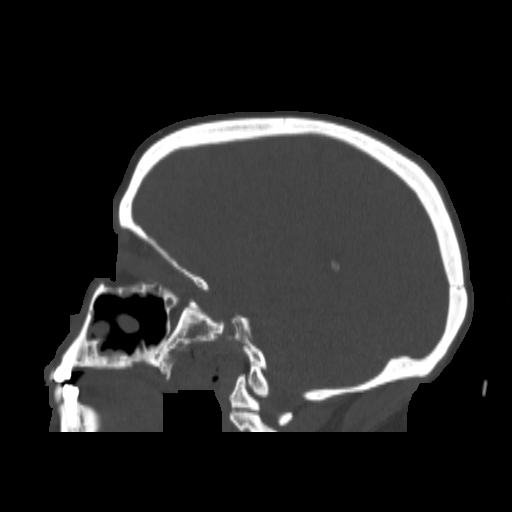
[im 57/82  bone]
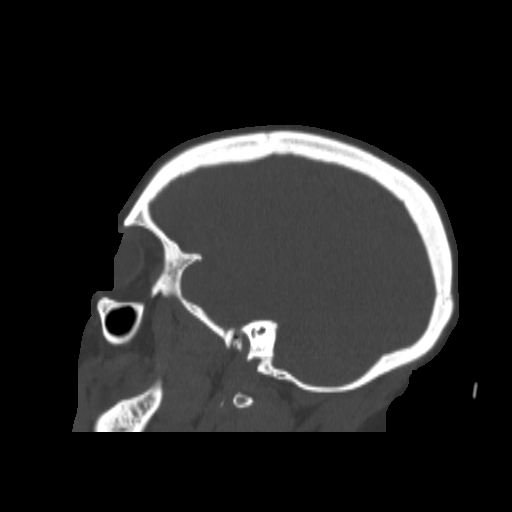
[im 65/82  bone]
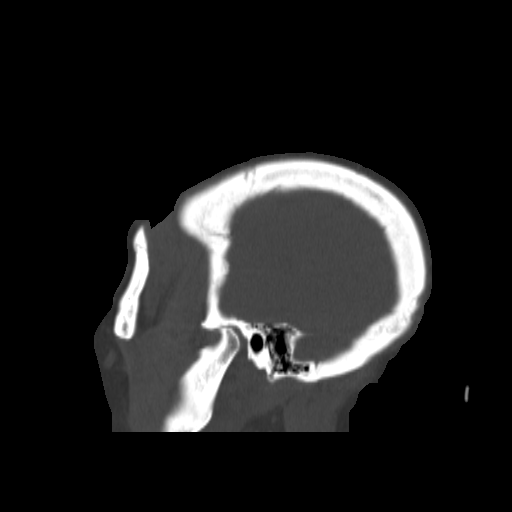
[im 73/82  brain]
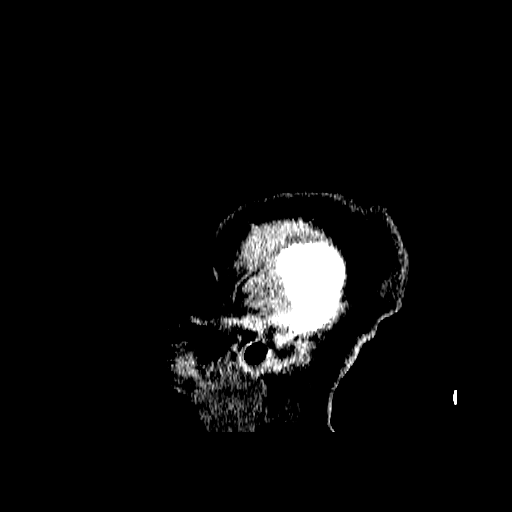
[im 73/82  bone]
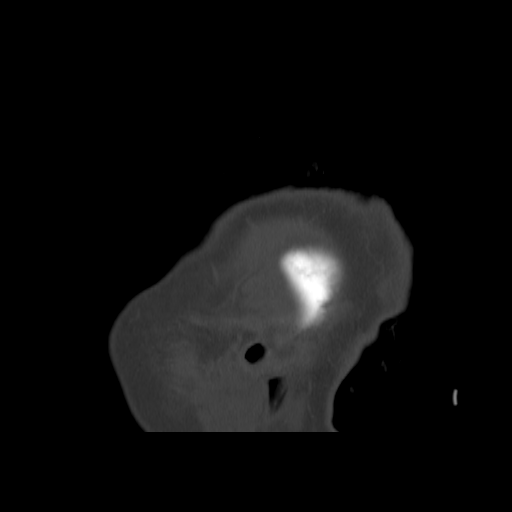

[16 of 37 positions shown; findings below may reference images not displayed]

FINDINGS: Fusion protocol for surgical guidance.

Frontal sinuses:  Diminutive.  Mucosal inflammation bilaterally.

Ethmoid sinuses: Previous ethmoidectomies. Residual widespread
mucosal thickening, slightly nodular.

Sphenoid sinus:  Mucosal thickening.  No layering fluid.

Maxillary sinuses: Widespread mucosal thickening. Lobular appearance
that could be secondary to retention cysts and/or polyps.

Previous nasoantral window creation. Wide communication between the
maxillary sinuses and naso ethmoid regions.

Nasal septum bows 2 mm towards the right.

Olfactory grooves are shallow, symmetric and normal. Anterior
ethmoidal notches are symmetric and normal. Lamina papyracea is
intact.
IMPRESSION: Previous ethmoidectomy and nasoantral window creation. Wide
communication of the maxillary sinuses with the naso ethmoid
airways.

Widespread mucosal thickening throughout the paranasal sinuses and
nasal passages. Nodularity could be due to small retention cysts or
polyps.

## 2019-04-28 NOTE — Progress Notes (Deleted)
No chief complaint on file.     History of Present Illness: 64 yo female with history of asthma, IDDM, GERD, hiatal hernia, HLD, HTN, esophageal stricture who is here today as a new patient for the evaluation of ***.   Primary Care Physician: System, Pcp Not In   Past Medical History:  Diagnosis Date  . Asthma   . Blind left eye    since childhood.   . Colon polyps 2000, 2014   probably adenomatous.  on q 3 year colonoscopy schedule.   . Diabetes mellitus without complication ([place])   . DM (diabetes mellitus), type 2 ([place]) 1999   requires insulin  . Dysphagia 04/07/2013  . GERD (gastroesophageal reflux disease)    with HH.  EGD ~ 2010 in [place]  . High cholesterol   . Hypertension   . Status post dilation of esophageal narrowing 04/07/2013  . Thyroid disease    Partial thyroidectomy  . UTI (lower urinary tract infection) 04/07/2013  . Vertigo 04/07/2013    Past Surgical History:  Procedure Laterality Date  . APPENDECTOMY    . ESOPHAGOGASTRODUODENOSCOPY N/A 04/09/2013   Procedure: ESOPHAGOGASTRODUODENOSCOPY (EGD);  Surgeon: Jerene Bears, MD;  Location: [facility] ENDOSCOPY;  Service: Endoscopy;  Laterality: N/A;  . PARTIAL HYSTERECTOMY    . THYROIDECTOMY, PARTIAL      Current Outpatient Medications  Medication Sig Dispense Refill  . albuterol (PROVENTIL HFA;VENTOLIN HFA) 108 (90 BASE) MCG/ACT inhaler Inhale 2 puffs into the lungs every 6 (six) hours as needed for wheezing or shortness of breath.    Marland Kitchen albuterol (PROVENTIL) (2.5 MG/3ML) 0.083% nebulizer solution Take 2.5 mg by nebulization every 6 (six) hours as needed for wheezing or shortness of breath.    Marland Kitchen BYDUREON 2 MG PEN Inject 1 each as directed once a week.    . docusate sodium (COLACE) 100 MG capsule Take 100 mg by mouth 2 (two) times daily.    . fluconazole (DIFLUCAN) 200 MG tablet Take 1 tablet (200 mg total) by mouth daily. 1 tablet 0  . fluticasone (FLONASE) 50 MCG/ACT nasal spray Place 1 spray into both nostrils  daily. 1 g 0  . Fluticasone-Salmeterol (ADVAIR) 250-50 MCG/DOSE AEPB Inhale 1 puff into the lungs 2 (two) times daily.    Marland Kitchen glimepiride (AMARYL) 2 MG tablet Take 2 mg by mouth 2 (two) times daily.    . insulin glargine (LANTUS) 100 UNIT/ML injection Inject 0.25 mLs (25 Units total) into the skin at bedtime. 10 mL 11  . levothyroxine (SYNTHROID, LEVOTHROID) 175 MCG tablet Take 175 mcg by mouth daily before breakfast.    . metoprolol (LOPRESSOR) 100 MG tablet Take 100 mg by mouth 2 (two) times daily.    . pantoprazole (PROTONIX) 40 MG tablet Take 1 tablet (40 mg total) by mouth 2 (two) times daily. 60 tablet 0  . simvastatin (ZOCOR) 40 MG tablet Take 40 mg by mouth daily at 6 PM.    . zolpidem (AMBIEN) 10 MG tablet Take 10 mg by mouth at bedtime.     No current facility-administered medications for this visit.    Allergies  Allergen Reactions  . Avelox [Moxifloxacin Hcl In Nacl] Hives, Itching and Rash  . Oxycontin [Oxycodone Hcl] Hives, Itching and Rash  . Penicillins Hives, Itching and Rash  . Tylenol [Acetaminophen]     Issues with her blood & liver per pt    Social History   Socioeconomic History  . Marital status: Divorced    Spouse name: Not  on file  . Number of children: Not on file  . Years of education: Not on file  . Highest education level: Not on file  Occupational History  . Not on file  Tobacco Use  . Smoking status: Never Smoker  Substance and Sexual Activity  . Alcohol use: No  . Drug use: Not on file  . Sexual activity: Not on file  Other Topics Concern  . Not on file  Social History Narrative  . Not on file   Social Determinants of Health   Financial Resource Strain:   . Difficulty of Paying Living Expenses:   Food Insecurity:   . Worried About Programme researcher, broadcasting/film/video in the Last Year:   . Barista in the Last Year:   Transportation Needs:   . Freight forwarder (Medical):   Marland Kitchen Lack of Transportation (Non-Medical):   Physical Activity:   .  Days of Exercise per Week:   . Minutes of Exercise per Session:   Stress:   . Feeling of Stress :   Social Connections:   . Frequency of Communication with Friends and Family:   . Frequency of Social Gatherings with Friends and Family:   . Attends Religious Services:   . Active Member of Clubs or Organizations:   . Attends Banker Meetings:   Marland Kitchen Marital Status:   Intimate Partner Violence:   . Fear of Current or Ex-Partner:   . Emotionally Abused:   Marland Kitchen Physically Abused:   . Sexually Abused:     No family history on file.  Review of Systems:  As stated in the HPI and otherwise negative.   There were no vitals taken for this visit.  Physical Examination: General: Well developed, well nourished, NAD  HEENT: OP clear, mucus membranes moist  SKIN: warm, dry. No rashes. Neuro: No focal deficits  Musculoskeletal: Muscle strength 5/5 all ext  Psychiatric: Mood and affect normal  Neck: No JVD, no carotid bruits, no thyromegaly, no lymphadenopathy.  Lungs:Clear bilaterally, no wheezes, rhonci, crackles Cardiovascular: Regular rate and rhythm. No murmurs, gallops or rubs. Abdomen:Soft. Bowel sounds present. Non-tender.  Extremities: No lower extremity edema. Pulses are 2 + in the bilateral DP/PT.  EKG:  EKG {ACTION; IS/IS JOA:41660630} ordered today. The ekg ordered today demonstrates ***  Recent Labs: No results found for requested labs within last 8760 hours.   Lipid Panel No results found for: CHOL, TRIG, HDL, CHOLHDL, VLDL, LDLCALC, LDLDIRECT   Wt Readings from Last 3 Encounters:  04/09/13 264 lb 4.8 oz (119.9 kg)     Other studies Reviewed: Additional studies/ records that were reviewed today include: ***. Review of the above records demonstrates: ***   Assessment and Plan:   1.   Current medicines are reviewed at length with the patient today.  The patient {ACTIONS; HAS/DOES NOT HAVE:19233} concerns regarding medicines.  The following changes have  been made:  {PLAN; NO CHANGE:13088:s}  Labs/ tests ordered today include: *** No orders of the defined types were placed in this encounter.    Disposition:   FU with *** in {gen number 1-60:109323} {TIME; UNITS DAY/WEEK/MONTH:19136}   Signed, Verne Carrow, MD 04/28/2019 12:15 PM    [facility] [address] [place], [place], [place]  [address] Phone: [phone]; Fax: [phone]

## 2019-04-29 ENCOUNTER — Ambulatory Visit: Payer: Medicaid - Out of State | Admitting: Cardiovascular Disease

## 2019-05-21 ENCOUNTER — Other Ambulatory Visit: Payer: Self-pay

## 2019-05-21 ENCOUNTER — Encounter: Payer: Self-pay | Admitting: Cardiovascular Disease

## 2019-05-21 ENCOUNTER — Ambulatory Visit (INDEPENDENT_AMBULATORY_CARE_PROVIDER_SITE_OTHER): Payer: Medicare Other | Admitting: Cardiovascular Disease

## 2019-05-21 VITALS — BP 136/80 | HR 78 | Ht >= 80 in | Wt 232.1 lb

## 2019-05-21 DIAGNOSIS — I10 Essential (primary) hypertension: Secondary | ICD-10-CM | POA: Diagnosis not present

## 2019-05-21 NOTE — Progress Notes (Signed)
Chief Complaint  Patient presents with  . New Patient (Initial Visit)   History of Present Illness: 64 yo female with history of asthma, diabetes type 2, GERD, hypertension, hyperlipidemia and obesity here today as a new patient for the evaluation of HTN. She has not tolerated many anti-hypertensive medications. She is now on Lopressor and her BP is controlled at home. She tells me today that she feels well. No chest pain or dyspnea.   Primary Care Physician: System, Pcp Not In   Past Medical History:  Diagnosis Date  . Asthma   . Blind left eye    since childhood.   . Colon polyps 2000, 2014   probably adenomatous.  on q 3 year colonoscopy schedule.   . Diabetes mellitus without complication ([place])   . DM (diabetes mellitus), type 2 ([place]) 1999   requires insulin  . Dysphagia 04/07/2013  . GERD (gastroesophageal reflux disease)    with HH.  EGD ~ 2010 in [place]  . High cholesterol   . Hypertension   . Mood disorder ([place])   . Morbid obesity ([place])   . Status post dilation of esophageal narrowing 04/07/2013  . Thyroid disease    Partial thyroidectomy  . UTI (lower urinary tract infection) 04/07/2013  . Vertigo 04/07/2013    Past Surgical History:  Procedure Laterality Date  . APPENDECTOMY    . ESOPHAGOGASTRODUODENOSCOPY N/A 04/09/2013   Procedure: ESOPHAGOGASTRODUODENOSCOPY (EGD);  Surgeon: Jerene Bears, MD;  Location: [facility] ENDOSCOPY;  Service: Endoscopy;  Laterality: N/A;  . PARTIAL HYSTERECTOMY    . THYROIDECTOMY, PARTIAL      Current Outpatient Medications  Medication Sig Dispense Refill  . albuterol (PROVENTIL HFA;VENTOLIN HFA) 108 (90 BASE) MCG/ACT inhaler Inhale 2 puffs into the lungs every 6 (six) hours as needed for wheezing or shortness of breath.    Marland Kitchen albuterol (PROVENTIL) (2.5 MG/3ML) 0.083% nebulizer solution Take 2.5 mg by nebulization every 6 (six) hours as needed for wheezing or shortness of breath.    Marland Kitchen BYDUREON 2 MG PEN Inject 1 each as directed once a  week.    . docusate sodium (COLACE) 100 MG capsule Take 100 mg by mouth 2 (two) times daily.    . fluconazole (DIFLUCAN) 200 MG tablet Take 1 tablet (200 mg total) by mouth daily. 1 tablet 0  . fluticasone (FLONASE) 50 MCG/ACT nasal spray Place 1 spray into both nostrils daily. 1 g 0  . Fluticasone-Salmeterol (ADVAIR) 250-50 MCG/DOSE AEPB Inhale 1 puff into the lungs 2 (two) times daily.    Marland Kitchen glimepiride (AMARYL) 2 MG tablet Take 2 mg by mouth 2 (two) times daily.    . hydrOXYzine (ATARAX/VISTARIL) 25 MG tablet Take 25 mg by mouth as needed.    . insulin glargine (LANTUS) 100 UNIT/ML injection Inject 0.25 mLs (25 Units total) into the skin at bedtime. 10 mL 11  . insulin lispro (HUMALOG) 100 UNIT/ML injection Inject 15 Units into the skin 3 (three) times daily before meals.    . insulin regular (NOVOLIN R) 100 units/mL injection Inject into the skin 3 (three) times daily before meals. 15 units am 18 units pm    . levothyroxine (SYNTHROID, LEVOTHROID) 175 MCG tablet Take 175 mcg by mouth daily before breakfast.    . lovastatin (MEVACOR) 20 MG tablet Take 20 mg by mouth at bedtime.    . metoprolol (LOPRESSOR) 100 MG tablet Take 100 mg by mouth 2 (two) times daily.    . pantoprazole (PROTONIX) 40 MG tablet  Take 1 tablet (40 mg total) by mouth 2 (two) times daily. 60 tablet 0  . simvastatin (ZOCOR) 40 MG tablet Take 40 mg by mouth daily at 6 PM.    . zolpidem (AMBIEN) 10 MG tablet Take 10 mg by mouth at bedtime.     No current facility-administered medications for this visit.    Allergies  Allergen Reactions  . Avelox [Moxifloxacin Hcl In Nacl] Hives, Itching and Rash  . Oxycontin [Oxycodone Hcl] Hives, Itching and Rash  . Penicillins Hives, Itching and Rash  . Amlodipine Besylate Cough  . Diovan [Valsartan] Cough  . Fluzone Quadrivalent [Influenza Vac Split Quad] Nausea And Vomiting  . Hydrochlorothiazide     FREQUENT URINATION  . Levemir [Insulin Detemir]     SOB   . Lisinopril Cough    . Metformin And Related     MUSCLE SPASMS   . Pravastatin Nausea And Vomiting  . Spironolactone Nausea Only  . Evaristo Bury [Insulin Degludec] Other (See Comments)    HEADACHES   . Tylenol [Acetaminophen]     Issues with her blood & liver per pt  . Lantus Solostar [Insulin Glargine] Rash    HIVES  . Lipitor [Atorvastatin] Rash    Social History   Socioeconomic History  . Marital status: Divorced    Spouse name: Not on file  . Number of children: Not on file  . Years of education: Not on file  . Highest education level: Not on file  Occupational History  . Not on file  Tobacco Use  . Smoking status: Never Smoker  . Smokeless tobacco: Never Used  Substance and Sexual Activity  . Alcohol use: No  . Drug use: Not on file  . Sexual activity: Not on file  Other Topics Concern  . Not on file  Social History Narrative  . Not on file   Social Determinants of Health   Financial Resource Strain:   . Difficulty of Paying Living Expenses:   Food Insecurity:   . Worried About Programme researcher, broadcasting/film/video in the Last Year:   . Barista in the Last Year:   Transportation Needs:   . Freight forwarder (Medical):   Marland Kitchen Lack of Transportation (Non-Medical):   Physical Activity:   . Days of Exercise per Week:   . Minutes of Exercise per Session:   Stress:   . Feeling of Stress :   Social Connections:   . Frequency of Communication with Friends and Family:   . Frequency of Social Gatherings with Friends and Family:   . Attends Religious Services:   . Active Member of Clubs or Organizations:   . Attends Banker Meetings:   Marland Kitchen Marital Status:   Intimate Partner Violence:   . Fear of Current or Ex-Partner:   . Emotionally Abused:   Marland Kitchen Physically Abused:   . Sexually Abused:     History reviewed. No pertinent family history.  Review of Systems:  As stated in the HPI and otherwise negative.   BP 136/80   Pulse 78   Ht 7' (2.134 m)   Wt 232 lb 1.9 oz (105.3 kg)    SpO2 98%   BMI 23.13 kg/m   Physical Examination: General: Well developed, well nourished, NAD  HEENT: OP clear, mucus membranes moist  SKIN: warm, dry. No rashes. Neuro: No focal deficits  Musculoskeletal: Muscle strength 5/5 all ext  Psychiatric: Mood and affect normal  Neck: No JVD, no carotid bruits, no thyromegaly, no  lymphadenopathy.  Lungs:Clear bilaterally, no wheezes, rhonci, crackles Cardiovascular: Regular rate and rhythm. No murmurs, gallops or rubs. Abdomen:Soft. Bowel sounds present. Non-tender.  Extremities: No lower extremity edema. Pulses are 2 + in the bilateral DP/PT.  EKG:  EKG is ordered today. The ekg ordered today demonstrates Sinus  Recent Labs: No results found for requested labs within last 8760 hours.   Lipid Panel No results found for: CHOL, TRIG, HDL, CHOLHDL, VLDL, LDLCALC, LDLDIRECT   Wt Readings from Last 3 Encounters:  05/21/19 232 lb 1.9 oz (105.3 kg)  04/09/13 264 lb 4.8 oz (119.9 kg)      Assessment and Plan:   1. HTN: BP is well controlled. Continue current therapy. She is from [place] and is moving back there in 3 months. She does not wish to have any cardiac testing here. No further cardiac workup.   Current medicines are reviewed at length with the patient today.  The patient does not have concerns regarding medicines.  The following changes have been made:  no change  Labs/ tests ordered today include:   Orders Placed This Encounter  Procedures  . EKG 12-Lead     Disposition:   FU with me as needed.    Signed, Verne Carrow, MD 05/21/2019 10:04 AM    [facility] [address] [place], [place], [place]  [address] Phone: [phone]; Fax: [phone]

## 2019-05-21 NOTE — Patient Instructions (Signed)
Medication Instructions:  No changes *If you need a refill on your cardiac medications before your next appointment, please call your pharmacy*   Lab Work: None ordered  Testing/Procedures: None ordered  Follow-Up: Your physician recommends that you schedule a follow-up appointment as needed.   Other Instructions

## 2019-06-06 ENCOUNTER — Emergency Department (HOSPITAL_COMMUNITY)
Admission: EM | Admit: 2019-06-06 | Discharge: 2019-06-06 | Disposition: A | Discharge status: Home / Self Care | Payer: Medicare Other | Attending: Emergency Medicine | Admitting: Emergency Medicine

## 2019-06-06 ENCOUNTER — Other Ambulatory Visit: Payer: Self-pay

## 2019-06-06 ENCOUNTER — Encounter (HOSPITAL_COMMUNITY): Payer: Self-pay | Admitting: *Deleted

## 2019-06-06 DIAGNOSIS — G8929 Other chronic pain: Secondary | ICD-10-CM | POA: Insufficient documentation

## 2019-06-06 DIAGNOSIS — Z794 Long term (current) use of insulin: Secondary | ICD-10-CM | POA: Diagnosis not present

## 2019-06-06 DIAGNOSIS — M25562 Pain in left knee: Secondary | ICD-10-CM | POA: Insufficient documentation

## 2019-06-06 DIAGNOSIS — I10 Essential (primary) hypertension: Secondary | ICD-10-CM | POA: Diagnosis not present

## 2019-06-06 DIAGNOSIS — M25561 Pain in right knee: Secondary | ICD-10-CM | POA: Diagnosis not present

## 2019-06-06 DIAGNOSIS — Z79899 Other long term (current) drug therapy: Secondary | ICD-10-CM | POA: Insufficient documentation

## 2019-06-06 DIAGNOSIS — E119 Type 2 diabetes mellitus without complications: Secondary | ICD-10-CM | POA: Insufficient documentation

## 2019-06-06 MED ORDER — MORPHINE SULFATE (PF) 4 MG/ML IV SOLN
6.0000 mg | Freq: Once | INTRAVENOUS | Status: AC
  Administered 2019-06-06: 6 mg via INTRAMUSCULAR
  Filled 2019-06-06: qty 2

## 2019-06-06 MED ORDER — DICLOFENAC SODIUM 1 % EX GEL: 2.0000 g | Freq: Four times a day (QID) | CUTANEOUS | 0 refills | Status: DC

## 2019-06-06 MED ORDER — DICLOFENAC SODIUM 1 % EX GEL: 2.0000 g | Freq: Four times a day (QID) | CUTANEOUS | 0 refills | Status: AC

## 2019-06-06 NOTE — ED Triage Notes (Signed)
Pt complains of bilateral knee pain and swollen for the past 3 days. Pt thinks she needs a right knee replacement, was told she had bone on bone. She states she has been going up and down stairs at an apartment, making pain worse.

## 2019-06-06 NOTE — ED Provider Notes (Signed)
[facility] DEPT Provider Note   CSN: [id number] Arrival date & time: 06/06/19  1517     History Chief Complaint  Patient presents with  . Knee Pain    Emily Dixon is a 64 y.o. female.  64 year old female presents with bilateral knee pain which is chronic in nature.  Patient states that she needs to have knee replacements due to chronic arthritis.  Denies any new trauma.  States that she has been climbing stairs more frequently at this time and the pain is gotten worse over last several days .  She has tried moist heat as well as ice without relief.  Denies any hip discomfort.  Pain better with remaining still        Past Medical History:  Diagnosis Date  . Asthma   . Blind left eye    since childhood.   . Colon polyps 2000, 2014   probably adenomatous.  on q 3 year colonoscopy schedule.   . Diabetes mellitus without complication ([place])   . DM (diabetes mellitus), type 2 ([place]) 1999   requires insulin  . Dysphagia 04/07/2013  . GERD (gastroesophageal reflux disease)    with HH.  EGD ~ 2010 in [place]  . High cholesterol   . Hypertension   . Mood disorder ([place])   . Morbid obesity ([place])   . Status post dilation of esophageal narrowing 04/07/2013  . Thyroid disease    Partial thyroidectomy  . UTI (lower urinary tract infection) 04/07/2013  . Vertigo 04/07/2013    Patient Active Problem List   Diagnosis Date Noted  . Vertigo 04/07/2013  . Dysphagia 04/07/2013  . Status post dilation of esophageal narrowing 04/07/2013  . UTI (lower urinary tract infection) 04/07/2013  . Diabetes mellitus without complication ([place])   . High cholesterol   . Hypertension     Past Surgical History:  Procedure Laterality Date  . APPENDECTOMY    . ESOPHAGOGASTRODUODENOSCOPY N/A 04/09/2013   Procedure: ESOPHAGOGASTRODUODENOSCOPY (EGD);  Surgeon: Jerene Bears, MD;  Location: [facility] ENDOSCOPY;  Service: Endoscopy;  Laterality: N/A;  . PARTIAL HYSTERECTOMY    .  THYROIDECTOMY, PARTIAL       OB History   No obstetric history on file.     No family history on file.  Social History   Tobacco Use  . Smoking status: Never Smoker  . Smokeless tobacco: Never Used  Substance Use Topics  . Alcohol use: No  . Drug use: Not on file    Home Medications Prior to Admission medications   Medication Sig Start Date End Date Taking? Authorizing Provider  albuterol (PROVENTIL HFA;VENTOLIN HFA) 108 (90 BASE) MCG/ACT inhaler Inhale 2 puffs into the lungs every 6 (six) hours as needed for wheezing or shortness of breath.    [provider]  albuterol (PROVENTIL) (2.5 MG/3ML) 0.083% nebulizer solution Take 2.5 mg by nebulization every 6 (six) hours as needed for wheezing or shortness of breath.    [provider]  BYDUREON 2 MG PEN Inject 1 each as directed once a week. 03/19/13   [provider]  docusate sodium (COLACE) 100 MG capsule Take 100 mg by mouth 2 (two) times daily.    [provider]  fluconazole (DIFLUCAN) 200 MG tablet Take 1 tablet (200 mg total) by mouth daily. 04/11/13   Regalado, Belkys A, MD  fluticasone (FLONASE) 50 MCG/ACT nasal spray Place 1 spray into both nostrils daily. 04/11/13   Regalado, Cassie Freer, MD  Fluticasone-Salmeterol (ADVAIR) 250-50  MCG/DOSE AEPB Inhale 1 puff into the lungs 2 (two) times daily.    [provider]  glimepiride (AMARYL) 2 MG tablet Take 2 mg by mouth 2 (two) times daily.    [provider]  hydrOXYzine (ATARAX/VISTARIL) 25 MG tablet Take 25 mg by mouth as needed.    [provider]  insulin glargine (LANTUS) 100 UNIT/ML injection Inject 0.25 mLs (25 Units total) into the skin at bedtime. 04/11/13   Regalado, Belkys A, MD  insulin lispro (HUMALOG) 100 UNIT/ML injection Inject 15 Units into the skin 3 (three) times daily before meals.    [provider]  insulin regular (NOVOLIN R) 100 units/mL injection Inject into the skin 3 (three) times daily  before meals. 15 units am 18 units pm    [provider]  levothyroxine (SYNTHROID, LEVOTHROID) 175 MCG tablet Take 175 mcg by mouth daily before breakfast.    [provider]  lovastatin (MEVACOR) 20 MG tablet Take 20 mg by mouth at bedtime.    [provider]  metoprolol (LOPRESSOR) 100 MG tablet Take 100 mg by mouth 2 (two) times daily.    [provider]  pantoprazole (PROTONIX) 40 MG tablet Take 1 tablet (40 mg total) by mouth 2 (two) times daily. 04/11/13   Regalado, Belkys A, MD  simvastatin (ZOCOR) 40 MG tablet Take 40 mg by mouth daily at 6 PM.    [provider]  zolpidem (AMBIEN) 10 MG tablet Take 10 mg by mouth at bedtime.    [provider]    Allergies    Avelox [moxifloxacin hcl in nacl], Oxycontin [oxycodone hcl], Penicillins, Amlodipine besylate, Diovan [valsartan], Fluzone quadrivalent [influenza vac split quad], Hydrochlorothiazide, Levemir [insulin detemir], Lisinopril, Metformin and related, Pravastatin, Spironolactone, Evaristo Bury [insulin degludec], Tylenol [acetaminophen], Lantus solostar [insulin glargine], and Lipitor [atorvastatin]  Review of Systems   Review of Systems  All other systems reviewed and are negative.   Physical Exam Updated Vital Signs BP (!) 146/78 (BP Location: Left Arm)   Pulse 78   Temp 98.3 F (36.8 C) (Oral)   Resp 18   SpO2 99%   Physical Exam Vitals and nursing note reviewed.  Constitutional:      General: She is not in acute distress.    Appearance: Normal appearance. She is well-developed. She is not toxic-appearing.  HENT:     Head: Normocephalic and atraumatic.  Eyes:     General: Lids are normal.     Conjunctiva/sclera: Conjunctivae normal.     Pupils: Pupils are equal, round, and reactive to light.  Neck:     Thyroid: No thyroid mass.     Trachea: No tracheal deviation.  Cardiovascular:     Rate and Rhythm: Normal rate and regular rhythm.     Heart sounds: Normal heart  sounds. No murmur. No gallop.   Pulmonary:     Effort: Pulmonary effort is normal. No respiratory distress.     Breath sounds: Normal breath sounds. No stridor. No decreased breath sounds, wheezing, rhonchi or rales.  Abdominal:     General: Bowel sounds are normal. There is no distension.     Palpations: Abdomen is soft.     Tenderness: There is no abdominal tenderness. There is no rebound.  Musculoskeletal:        General: No tenderness. Normal range of motion.     Cervical back: Normal range of motion and neck supple.       Legs:  Skin:    General: Skin  is warm and dry.     Findings: No abrasion or rash.  Neurological:     Mental Status: She is alert and oriented to person, place, and time.     GCS: GCS eye subscore is 4. GCS verbal subscore is 5. GCS motor subscore is 6.     Cranial Nerves: No cranial nerve deficit.     Sensory: No sensory deficit.  Psychiatric:        Speech: Speech normal.        Behavior: Behavior normal.     ED Results / Procedures / Treatments   Labs (all labs ordered are listed, but only abnormal results are displayed) Labs Reviewed - No data to display  EKG None  Radiology No results found.  Procedures Procedures (including critical care time)  Medications Ordered in ED Medications - No data to display  ED Course  I have reviewed the triage vital signs and the nursing notes.  Pertinent labs & imaging results that were available during my care of the patient were reviewed by me and considered in my medical decision making (see chart for details).    MDM Rules/Calculators/A&P                      Patient medicated for pain here and will give prescription for same and discharged Final Clinical Impression(s) / ED Diagnoses Final diagnoses:  None    Rx / DC Orders ED Discharge Orders    None       Lorre Nick, MD 06/06/19 1925

## 2019-07-20 ENCOUNTER — Other Ambulatory Visit: Payer: Self-pay | Admitting: Family Medicine

## 2019-07-20 DIAGNOSIS — Z1231 Encounter for screening mammogram for malignant neoplasm of breast: Secondary | ICD-10-CM

## 2019-07-30 ENCOUNTER — Other Ambulatory Visit: Payer: Self-pay | Admitting: Family Medicine

## 2019-07-30 DIAGNOSIS — N63 Unspecified lump in unspecified breast: Secondary | ICD-10-CM

## 2019-08-13 ENCOUNTER — Other Ambulatory Visit: Payer: Medicare Other

## 2021-08-08 ENCOUNTER — Emergency Department (HOSPITAL_COMMUNITY): Payer: 59

## 2021-08-08 ENCOUNTER — Encounter (HOSPITAL_COMMUNITY): Payer: Self-pay

## 2021-08-08 ENCOUNTER — Emergency Department (HOSPITAL_COMMUNITY)
Admission: EM | Admit: 2021-08-08 | Discharge: 2021-08-09 | Disposition: A | Discharge status: Home / Self Care | Payer: 59 | Attending: Emergency Medicine | Admitting: Emergency Medicine

## 2021-08-08 ENCOUNTER — Other Ambulatory Visit: Payer: Self-pay

## 2021-08-08 DIAGNOSIS — Z794 Long term (current) use of insulin: Secondary | ICD-10-CM | POA: Insufficient documentation

## 2021-08-08 DIAGNOSIS — R062 Wheezing: Secondary | ICD-10-CM | POA: Insufficient documentation

## 2021-08-08 DIAGNOSIS — Z79899 Other long term (current) drug therapy: Secondary | ICD-10-CM | POA: Insufficient documentation

## 2021-08-08 DIAGNOSIS — I10 Essential (primary) hypertension: Secondary | ICD-10-CM | POA: Diagnosis not present

## 2021-08-08 DIAGNOSIS — R7989 Other specified abnormal findings of blood chemistry: Secondary | ICD-10-CM

## 2021-08-08 DIAGNOSIS — R1310 Dysphagia, unspecified: Secondary | ICD-10-CM | POA: Diagnosis present

## 2021-08-08 DIAGNOSIS — Z7984 Long term (current) use of oral hypoglycemic drugs: Secondary | ICD-10-CM | POA: Insufficient documentation

## 2021-08-08 DIAGNOSIS — E119 Type 2 diabetes mellitus without complications: Secondary | ICD-10-CM | POA: Insufficient documentation

## 2021-08-08 LAB — CBC WITH DIFFERENTIAL/PLATELET
Abs Immature Granulocytes: 0.06 10*3/uL (ref 0.00–0.07)
Basophils Absolute: 0.1 10*3/uL (ref 0.0–0.1)
Basophils Relative: 0 %
Eosinophils Absolute: 0.2 10*3/uL (ref 0.0–0.5)
Eosinophils Relative: 2 %
HCT: 40.1 % (ref 36.0–46.0)
Hemoglobin: 12.8 g/dL (ref 12.0–15.0)
Immature Granulocytes: 0 %
Lymphocytes Relative: 50 %
Lymphs Abs: 7.2 10*3/uL — ABNORMAL HIGH (ref 0.7–4.0)
MCH: 24.6 pg — ABNORMAL LOW (ref 26.0–34.0)
MCHC: 31.9 g/dL (ref 30.0–36.0)
MCV: 77.1 fL — ABNORMAL LOW (ref 80.0–100.0)
Monocytes Absolute: 1.3 10*3/uL — ABNORMAL HIGH (ref 0.1–1.0)
Monocytes Relative: 9 %
Neutro Abs: 5.6 10*3/uL (ref 1.7–7.7)
Neutrophils Relative %: 39 %
Platelets: 352 10*3/uL (ref 150–400)
RBC: 5.2 MIL/uL — ABNORMAL HIGH (ref 3.87–5.11)
RDW: 18.9 % — ABNORMAL HIGH (ref 11.5–15.5)
WBC: 14.4 10*3/uL — ABNORMAL HIGH (ref 4.0–10.5)
nRBC: 0 % (ref 0.0–0.2)

## 2021-08-08 LAB — TSH: TSH: 28.555 u[IU]/mL — ABNORMAL HIGH (ref 0.350–4.500)

## 2021-08-08 LAB — BASIC METABOLIC PANEL
Anion gap: 9 (ref 5–15)
BUN: 18 mg/dL (ref 8–23)
CO2: 27 mmol/L (ref 22–32)
Calcium: 9.5 mg/dL (ref 8.9–10.3)
Chloride: 102 mmol/L (ref 98–111)
Creatinine, Ser: 1.46 mg/dL — ABNORMAL HIGH (ref 0.44–1.00)
GFR, Estimated: 40 mL/min — ABNORMAL LOW (ref >60)
Glucose, Bld: 152 mg/dL — ABNORMAL HIGH (ref 70–99)
Potassium: 3.8 mmol/L (ref 3.5–5.1)
Sodium: 138 mmol/L (ref 135–145)

## 2021-08-08 LAB — BRAIN NATRIURETIC PEPTIDE: B Natriuretic Peptide: 47.3 pg/mL (ref 0.0–100.0)

## 2021-08-08 MED ORDER — IOHEXOL 300 MG/ML  SOLN
75.0000 mL | Freq: Once | INTRAMUSCULAR | Status: AC | PRN
  Administered 2021-08-08: 75 mL via INTRAVENOUS

## 2021-08-08 MED ORDER — IPRATROPIUM-ALBUTEROL 0.5-2.5 (3) MG/3ML IN SOLN
3.0000 mL | Freq: Once | RESPIRATORY_TRACT | Status: AC
  Administered 2021-08-08: 3 mL via RESPIRATORY_TRACT
  Filled 2021-08-08: qty 3

## 2021-08-08 NOTE — ED Provider Notes (Signed)
[facility]-EMERGENCY DEPT Provider Note   CSN: [id number] Arrival date & time: 08/08/21  2103     History  Chief Complaint  Patient presents with   Oral Swelling   Wheezing    Emily Dixon is a 66 y.o. female.  The history is provided by the patient and medical records.  Wheezing  66 year old female with history of diabetes, hypertension, vertigo, presenting to the ED with difficulty swallowing.  States for the past 3 days she has had a globus sensation of the throat, almost like there is something stuck in the right side of her throat.  She denies eating any large meals.  She is still able to eat and drink but feels like she has to push on the right side of her neck for it to go down appropriately.  She has had scant amount of vomiting.  She reports this happened once before and underwent EGD but was told everything was normal.  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  She does have numerous medication allergies but denies any recent ingestion.  She is not on an ACEI.  Denies fever, chills, cough, or painful swallowing.  Home Medications Prior to Admission medications   Medication Sig Start Date End Date Taking? Authorizing Provider  albuterol (PROVENTIL HFA;VENTOLIN HFA) 108 (90 BASE) MCG/ACT inhaler Inhale 2 puffs into the lungs every 6 (six) hours as needed for wheezing or shortness of breath.   Yes [provider]  albuterol (PROVENTIL) (2.5 MG/3ML) 0.083% nebulizer solution Take 2.5 mg by nebulization every 6 (six) hours as needed for wheezing or shortness of breath.   Yes [provider]  diclofenac Sodium (VOLTAREN) 1 % GEL Apply 2 g topically 4 (four) times daily. 06/06/19  Yes Lorre Nick, MD  docusate sodium (COLACE) 100 MG capsule Take 100 mg by mouth daily.   Yes [provider]  fluticasone (FLONASE) 50 MCG/ACT nasal spray Place 1 spray into both nostrils daily. 04/11/13  Yes Regalado, Belkys A, MD   hydrALAZINE (APRESOLINE) 50 MG tablet Take 75 mg by mouth 3 (three) times daily. 03/05/21  Yes [provider]  insulin lispro (HUMALOG) 100 UNIT/ML injection Inject 14 Units into the skin 3 (three) times daily before meals.   Yes [provider]  levothyroxine (SYNTHROID) 112 MCG tablet Take 112 mcg by mouth daily. 03/05/21  Yes [provider]  metoprolol (LOPRESSOR) 100 MG tablet Take 100 mg by mouth 2 (two) times daily.   Yes [provider]  mirtazapine (REMERON) 7.5 MG tablet Take 7.5 mg by mouth at bedtime as needed (for sleep). 03/05/21  Yes [provider]  pantoprazole (PROTONIX) 40 MG tablet Take 1 tablet (40 mg total) by mouth 2 (two) times daily. 04/11/13  Yes Regalado, Belkys A, MD  TRELEGY ELLIPTA 200-62.5-25 MCG/ACT AEPB Inhale 1 puff into the lungs daily. 03/05/21  Yes [provider]  TRESIBA FLEXTOUCH 100 UNIT/ML FlexTouch Pen Inject 36 Units into the skin at bedtime as needed (for blood glucose greater than 140). 05/02/21  Yes [provider]  levothyroxine (SYNTHROID, LEVOTHROID) 175 MCG tablet Take 175 mcg by mouth daily before breakfast. Patient not taking: Reported on 08/09/2021    [provider]  lovastatin (MEVACOR) 20 MG tablet Take 20 mg by mouth at bedtime. Patient not taking: Reported on 08/09/2021    [provider]  simvastatin (ZOCOR) 40 MG tablet Take 40 mg by mouth daily at 6 PM. Patient not taking: Reported on  08/09/2021    [provider]      Allergies    Avelox [moxifloxacin hcl in nacl], Oxycontin [oxycodone hcl], Penicillins, Amlodipine besylate, Diovan [valsartan], Fluzone quadrivalent [influenza vac split quad], Levemir [insulin detemir], Lisinopril, Metformin and related, Pravastatin, Spironolactone, Tylenol [acetaminophen], Lantus solostar [insulin glargine], and Lipitor [atorvastatin]    Review of Systems   Review of Systems  HENT:  Positive for trouble swallowing.    Respiratory:  Positive for wheezing.   All other systems reviewed and are negative.   Physical Exam Updated Vital Signs BP (!) 190/104   Pulse 71   Temp 98.2 F (36.8 C)   Resp 14   SpO2 99%   Physical Exam Vitals and nursing note reviewed.  Constitutional:      Appearance: She is well-developed.  HENT:     Head: Normocephalic and atraumatic.     Mouth/Throat:     Comments: No lip or tongue swelling, oropharynx clear without edema or FB noted, handing secretions well, no stridor Eyes:     Conjunctiva/sclera: Conjunctivae normal.     Pupils: Pupils are equal, round, and reactive to light.  Neck:     Comments: No apparent swelling of the neck, no masses, no lymphadenopathy Cardiovascular:     Rate and Rhythm: Normal rate and regular rhythm.     Heart sounds: Normal heart sounds.  Pulmonary:     Effort: Pulmonary effort is normal.     Breath sounds: Wheezing present.     Comments: No distress noted, diffuse expiratory wheezes present, speaking in sentences without difficulty Abdominal:     General: Bowel sounds are normal.     Palpations: Abdomen is soft.  Musculoskeletal:        General: Normal range of motion.     Cervical back: Normal range of motion.  Skin:    General: Skin is warm and dry.  Neurological:     Mental Status: She is alert and oriented to person, place, and time.     ED Results / Procedures / Treatments   Labs (all labs ordered are listed, but only abnormal results are displayed) Labs Reviewed  BASIC METABOLIC PANEL - Abnormal; Notable for the following components:      Result Value   Glucose, Bld 152 (*)    Creatinine, Ser 1.46 (*)    GFR, Estimated 40 (*)    All other components within normal limits  CBC WITH DIFFERENTIAL/PLATELET - Abnormal; Notable for the following components:   WBC 14.4 (*)    RBC 5.20 (*)    MCV 77.1 (*)    MCH 24.6 (*)    RDW 18.9 (*)    Lymphs Abs 7.2 (*)    Monocytes Absolute 1.3 (*)    All other components  within normal limits  TSH - Abnormal; Notable for the following components:   TSH 28.555 (*)    All other components within normal limits  BRAIN NATRIURETIC PEPTIDE  PATHOLOGIST SMEAR REVIEW    EKG None  Radiology CT Soft Tissue Neck W Contrast  Result Date: 08/08/2021 CLINICAL DATA:  Palate weakness. EXAM: CT NECK WITH CONTRAST TECHNIQUE: Multidetector CT imaging of the neck was performed using the standard protocol following the bolus administration of intravenous contrast. RADIATION DOSE REDUCTION: This exam was performed according to the departmental dose-optimization program which includes automated exposure control, adjustment of the mA and/or kV according to patient size and/or use of iterative reconstruction technique. CONTRAST:  35mL OMNIPAQUE IOHEXOL 300 MG/ML  SOLN COMPARISON:  None Available. FINDINGS: PHARYNX AND LARYNX: The nasopharynx, oropharynx and larynx are normal. Visible portions of the oral cavity, tongue base and floor of mouth are normal. Normal epiglottis, vallecula and pyriform sinuses. The larynx is normal. No retropharyngeal abscess, effusion or lymphadenopathy. SALIVARY GLANDS: Normal parotid, submandibular and sublingual glands. THYROID: Normal. LYMPH NODES: No enlarged or abnormal density lymph nodes. VASCULAR: Major cervical vessels are patent. LIMITED INTRACRANIAL: Normal. VISUALIZED ORBITS: Normal. MASTOIDS AND VISUALIZED PARANASAL SINUSES: No fluid levels or advanced mucosal thickening. No mastoid effusion. SKELETON: No bony spinal canal stenosis. No lytic or blastic lesions. UPPER CHEST: Clear. OTHER: None. IMPRESSION: Normal CT of the neck. No finding to explain reported globus sensation. Electronically Signed   By: Deatra Robinson M.D.   On: 08/08/2021 23:02   DG Chest 2 View  Result Date: 08/08/2021 CLINICAL DATA:  Shortness of breath EXAM: CHEST - 2 VIEW COMPARISON:  None Available. FINDINGS: The heart size and mediastinal contours are within normal limits.  Both lungs are clear. The visualized skeletal structures are unremarkable. IMPRESSION: No active cardiopulmonary disease. Electronically Signed   By: Deatra Robinson M.D.   On: 08/08/2021 21:50    Procedures Procedures    Medications Ordered in ED Medications  ipratropium-albuterol (DUONEB) 0.5-2.5 (3) MG/3ML nebulizer solution 3 mL (3 mLs Nebulization Given 08/08/21 2219)  iohexol (OMNIPAQUE) 300 MG/ML solution 75 mL (75 mLs Intravenous Contrast Given 08/08/21 2235)    ED Course/ Medical Decision Making/ A&P                           Medical Decision Making Amount and/or Complexity of Data Reviewed External Data Reviewed: notes. Labs: ordered. Radiology: ordered and independent interpretation performed. ECG/medicine tests: ordered and independent interpretation performed.  Risk Prescription drug management.   66 year old female presenting to the ED with dysphagia.  States the past 3 days feels like there is something "stuck" in her throat.  She is still able to eat and drink with sporadic vomiting.  She is having no trouble handling her secretions.  She reports this happened once before and had EGD with dilation but no foreign body found.  She is awake, alert, oriented.  She has no lip or tongue swelling, handling secretions well, no stridor.  Oropharynx is clear without noted foreign body or edema.  She does have some wheezes on exam, reports history of asthma and has been using home nebs.  Chest x-ray was obtained from triage and reviewed, no acute findings.  Awaiting labs and CT of the neck.  Will give DuoNeb.  Labs as above--leukocytosis, no profound anemia.  No electrolyte derangement.  TSH is 28.555.  Does have known history of hypothyroidism, synthroid was decreased a few months ago and has not been back for follow-up.  CT soft tissue neck without acute findings.  On reassessment, remains without airway compromise.  Her lungs are now clear after DuoNeb.  Reviewed results of labs  and imaging findings.  Will refer back to PCP for management of her hypothyroidism.  Will refer back to GI for follow-up and possible repeat EGD as she may need dilation again.  She continues handling secretions well here in the ED and is able to tolerate water so I do not feel this needs to be done emergently.  She may return here for any new or acute changes.  Final Clinical Impression(s) / ED Diagnoses Final diagnoses:  Dysphagia, unspecified type  TSH elevation    Rx /  DC Orders ED Discharge Orders     None         Garlon Hatchet, PA-C 08/09/21 0238    Maia Plan, MD 08/15/21 [phone]

## 2021-08-08 NOTE — ED Notes (Signed)
Urine sent to main lab.

## 2021-08-08 NOTE — ED Triage Notes (Signed)
Pt states that her throat is swollen x 3 days. Pt states that she ate something and it will not go down. Pt is able to eat and drink as normal. Pt has wheezing bilaterally, hx of chronic asthma. Pt reports taking all medications as prescribed BP elevated upon triage.

## 2021-08-08 NOTE — ED Provider Triage Note (Signed)
Emergency Medicine Provider Triage Evaluation Note  Brindy Higginbotham , a 66 y.o. female  was evaluated in triage.  Pt complains of globus sensation. Sts she feels as if something is stuck in there throat x 3 days.  Able to tolerates liquid and solid food but still have the same sensation.  Endorse wheezes and sob similar to her asthma.  Has hx of thyroid disease and HTN, report being compliant with her meds.  Denies fever, headache,cp  Review of Systems  Positive: As above Negative: As above  Physical Exam  BP (!) 211/115 (BP Location: Left Arm) Comment: Triage RN notified  Pulse 84   Temp 98.4 F (36.9 C) (Oral)   Resp 18   SpO2 99%  Gen:   Awake, no distress   Resp:  Normal effort  MSK:   Moves extremities without difficulty  Other:    Medical Decision Making  Medically screening exam initiated at 9:25 PM.  Appropriate orders placed.  Erikka Follmer was informed that the remainder of the evaluation will be completed by another provider, this initial triage assessment does not replace that evaluation, and the importance of remaining in the ED until their evaluation is complete.     Fayrene Helper, PA-C 08/08/21 2137

## 2021-08-09 LAB — PATHOLOGIST SMEAR REVIEW

## 2021-08-09 NOTE — Discharge Instructions (Addendum)
Follow-up with your primary care doctor about your TSH.  Your medication will need to be adjusted. Call Dr. Sherald Barge office to get appt scheduled.  You may need to have another EGD. Return to the ED for new or worsening symptoms.
